# Patient Record
Sex: Female | Born: 1955 | Race: White | Hispanic: No | Smoking: Never smoker
Health system: Southern US, Community
[De-identification: ages and names within clinical notes are randomized; demographics above are authoritative.]

## PROBLEM LIST (undated history)

## (undated) DIAGNOSIS — K56609 Unspecified intestinal obstruction, unspecified as to partial versus complete obstruction: Secondary | ICD-10-CM

## (undated) DIAGNOSIS — M1990 Unspecified osteoarthritis, unspecified site: Secondary | ICD-10-CM

## (undated) DIAGNOSIS — Z86010 Personal history of colonic polyps: Secondary | ICD-10-CM

## (undated) DIAGNOSIS — I10 Essential (primary) hypertension: Secondary | ICD-10-CM

## (undated) DIAGNOSIS — K635 Polyp of colon: Secondary | ICD-10-CM

## (undated) DIAGNOSIS — K5790 Diverticulosis of intestine, part unspecified, without perforation or abscess without bleeding: Secondary | ICD-10-CM

## (undated) DIAGNOSIS — K5732 Diverticulitis of large intestine without perforation or abscess without bleeding: Secondary | ICD-10-CM

## (undated) DIAGNOSIS — R531 Weakness: Secondary | ICD-10-CM

## (undated) DIAGNOSIS — T7840XA Allergy, unspecified, initial encounter: Secondary | ICD-10-CM

## (undated) DIAGNOSIS — G4730 Sleep apnea, unspecified: Secondary | ICD-10-CM

## (undated) DIAGNOSIS — D649 Anemia, unspecified: Secondary | ICD-10-CM

## (undated) DIAGNOSIS — E669 Obesity, unspecified: Secondary | ICD-10-CM

## (undated) DIAGNOSIS — E079 Disorder of thyroid, unspecified: Secondary | ICD-10-CM

## (undated) HISTORY — DX: Diverticulosis of intestine, part unspecified, without perforation or abscess without bleeding: K57.90

## (undated) HISTORY — PX: TUBAL LIGATION: SHX77

## (undated) HISTORY — DX: Disorder of thyroid, unspecified: E07.9

## (undated) HISTORY — DX: Anemia, unspecified: D64.9

## (undated) HISTORY — DX: Unspecified intestinal obstruction, unspecified as to partial versus complete obstruction: K56.609

## (undated) HISTORY — PX: COLONOSCOPY W/ POLYPECTOMY: SHX1380

## (undated) HISTORY — DX: Polyp of colon: K63.5

## (undated) HISTORY — DX: Weakness: R53.1

## (undated) HISTORY — DX: Unspecified osteoarthritis, unspecified site: M19.90

## (undated) HISTORY — DX: Obesity, unspecified: E66.9

## (undated) HISTORY — DX: Allergy, unspecified, initial encounter: T78.40XA

## (undated) HISTORY — DX: Essential (primary) hypertension: I10

## (undated) HISTORY — DX: Sleep apnea, unspecified: G47.30

---
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
## 1999-05-19 ENCOUNTER — Other Ambulatory Visit: Admission: RE | Admit: 1999-05-19 | Discharge: 1999-05-19 | Payer: Self-pay | Admitting: Family Medicine

## 2000-09-19 ENCOUNTER — Other Ambulatory Visit: Admission: RE | Admit: 2000-09-19 | Discharge: 2000-09-19 | Payer: Self-pay | Admitting: Family Medicine

## 2001-12-22 ENCOUNTER — Other Ambulatory Visit: Admission: RE | Admit: 2001-12-22 | Discharge: 2001-12-22 | Payer: Self-pay | Admitting: Family Medicine

## 2003-10-15 ENCOUNTER — Encounter: Payer: Self-pay | Admitting: Internal Medicine

## 2004-07-16 ENCOUNTER — Encounter: Admission: RE | Admit: 2004-07-16 | Discharge: 2004-07-16 | Payer: Self-pay | Admitting: *Deleted

## 2004-07-21 ENCOUNTER — Ambulatory Visit (HOSPITAL_COMMUNITY): Admission: RE | Admit: 2004-07-21 | Discharge: 2004-07-21 | Payer: Self-pay | Admitting: *Deleted

## 2004-09-13 HISTORY — PX: SPINE SURGERY: SHX786

## 2004-09-13 HISTORY — PX: GASTRIC BYPASS: SHX52

## 2004-11-02 ENCOUNTER — Ambulatory Visit: Payer: Self-pay | Admitting: Psychology

## 2004-11-20 ENCOUNTER — Ambulatory Visit: Payer: Self-pay | Admitting: Psychology

## 2004-11-25 ENCOUNTER — Ambulatory Visit: Payer: Self-pay | Admitting: Psychology

## 2005-01-19 ENCOUNTER — Encounter: Admission: RE | Admit: 2005-01-19 | Discharge: 2005-04-19 | Payer: Self-pay | Admitting: *Deleted

## 2005-02-01 ENCOUNTER — Inpatient Hospital Stay (HOSPITAL_COMMUNITY): Admission: RE | Admit: 2005-02-01 | Discharge: 2005-02-03 | Payer: Self-pay | Admitting: *Deleted

## 2005-04-04 ENCOUNTER — Inpatient Hospital Stay (HOSPITAL_COMMUNITY): Admission: EM | Admit: 2005-04-04 | Discharge: 2005-04-06 | Payer: Self-pay | Admitting: *Deleted

## 2005-04-04 ENCOUNTER — Ambulatory Visit: Payer: Self-pay | Admitting: Sports Medicine

## 2005-04-15 ENCOUNTER — Ambulatory Visit (HOSPITAL_COMMUNITY): Admission: RE | Admit: 2005-04-15 | Discharge: 2005-04-16 | Payer: Self-pay | Admitting: Neurosurgery

## 2005-05-04 ENCOUNTER — Encounter: Admission: RE | Admit: 2005-05-04 | Discharge: 2005-08-02 | Payer: Self-pay | Admitting: *Deleted

## 2005-10-27 ENCOUNTER — Encounter: Admission: RE | Admit: 2005-10-27 | Discharge: 2006-01-25 | Payer: Self-pay | Admitting: *Deleted

## 2007-04-06 ENCOUNTER — Encounter: Payer: Self-pay | Admitting: Obstetrics and Gynecology

## 2007-04-06 ENCOUNTER — Ambulatory Visit (HOSPITAL_BASED_OUTPATIENT_CLINIC_OR_DEPARTMENT_OTHER): Admission: RE | Admit: 2007-04-06 | Discharge: 2007-04-06 | Payer: Self-pay | Admitting: Obstetrics and Gynecology

## 2007-07-10 ENCOUNTER — Encounter: Payer: Self-pay | Admitting: Obstetrics and Gynecology

## 2007-07-10 ENCOUNTER — Ambulatory Visit (HOSPITAL_BASED_OUTPATIENT_CLINIC_OR_DEPARTMENT_OTHER): Admission: RE | Admit: 2007-07-10 | Discharge: 2007-07-11 | Payer: Self-pay | Admitting: Obstetrics and Gynecology

## 2007-07-10 HISTORY — PX: ABDOMINAL HYSTERECTOMY: SHX81

## 2008-10-31 ENCOUNTER — Ambulatory Visit: Payer: Self-pay | Admitting: Internal Medicine

## 2008-10-31 DIAGNOSIS — Z9283 Personal history of failed moderate sedation: Secondary | ICD-10-CM | POA: Insufficient documentation

## 2008-10-31 DIAGNOSIS — D649 Anemia, unspecified: Secondary | ICD-10-CM | POA: Insufficient documentation

## 2008-10-31 LAB — CONVERTED CEMR LAB
Eosinophils Absolute: 0.3 10*3/uL (ref 0.0–0.7)
Ferritin: 23.9 ng/mL (ref 10.0–291.0)
Folate: 18.9 ng/mL
HCT: 38.5 % (ref 36.0–46.0)
Iron: 63 ug/dL (ref 42–145)
MCV: 86.3 fL (ref 78.0–100.0)
Monocytes Absolute: 0.4 10*3/uL (ref 0.1–1.0)
Neutro Abs: 3.2 10*3/uL (ref 1.4–7.7)
RDW: 12.8 % (ref 11.5–14.6)

## 2008-11-11 LAB — CONVERTED CEMR LAB: PTH: 60.9 pg/mL (ref 14.0–72.0)

## 2008-11-12 ENCOUNTER — Telehealth: Payer: Self-pay | Admitting: Internal Medicine

## 2008-11-26 ENCOUNTER — Encounter: Payer: Self-pay | Admitting: Internal Medicine

## 2008-11-28 ENCOUNTER — Ambulatory Visit: Payer: Self-pay | Admitting: Internal Medicine

## 2008-11-28 ENCOUNTER — Encounter: Payer: Self-pay | Admitting: Internal Medicine

## 2008-11-28 ENCOUNTER — Ambulatory Visit (HOSPITAL_COMMUNITY): Admission: RE | Admit: 2008-11-28 | Discharge: 2008-11-28 | Payer: Self-pay | Admitting: Internal Medicine

## 2008-12-10 ENCOUNTER — Encounter: Payer: Self-pay | Admitting: Internal Medicine

## 2009-07-18 ENCOUNTER — Encounter: Payer: Self-pay | Admitting: Surgery

## 2009-07-18 ENCOUNTER — Inpatient Hospital Stay (HOSPITAL_COMMUNITY): Admission: AD | Admit: 2009-07-18 | Discharge: 2009-07-21 | Payer: Self-pay | Admitting: Surgery

## 2009-08-25 ENCOUNTER — Encounter: Admission: RE | Admit: 2009-08-25 | Discharge: 2009-08-25 | Payer: Self-pay | Admitting: Surgery

## 2010-02-26 ENCOUNTER — Telehealth: Payer: Self-pay | Admitting: Internal Medicine

## 2010-10-13 NOTE — Progress Notes (Signed)
Summary: Schedule NP3 Visit  Phone Note Outgoing Call Call back at Home Phone [phone]   Call placed by: Harlow Mares CMA Duncan Dull),  February 26, 2010 2:13 PM Call placed to: Patient Summary of Call: No Answer, pt due for colonoscopy but needs to have her procedure done at the hospital with [facility] so Dr. Leone Payor would like for her to be seen in the office for a consult before its scheduled. She will need to schedule an NP3 visit.  Initial call taken by: Harlow Mares CMA Duncan Dull),  February 26, 2010 2:13 PM  Follow-up for Phone Call        pt number is disconnected, we will mail her a letter to call back and schedule an office visit. Follow-up by: Harlow Mares CMA Duncan Dull),  March 19, 2010 1:57 PM

## 2010-12-16 LAB — COMPREHENSIVE METABOLIC PANEL
ALT: 14 U/L (ref 0–35)
AST: 22 U/L (ref 0–37)
Calcium: 9.5 mg/dL (ref 8.4–10.5)
GFR calc Af Amer: >60 mL/min (ref >60)
Glucose, Bld: 129 mg/dL — ABNORMAL HIGH (ref 70–99)
Potassium: 3.9 mEq/L (ref 3.5–5.1)
Sodium: 138 mEq/L (ref 135–145)
Total Bilirubin: 0.6 mg/dL (ref 0.3–1.2)

## 2010-12-16 LAB — DIFFERENTIAL
Basophils Absolute: 0.1 10*3/uL (ref 0.0–0.1)
Basophils Relative: 1 % (ref 0–1)
Eosinophils Absolute: 0.1 10*3/uL (ref 0.0–0.7)
Monocytes Relative: 5 % (ref 3–12)
Neutrophils Relative %: 74 % (ref 43–77)

## 2010-12-16 LAB — URINALYSIS, ROUTINE W REFLEX MICROSCOPIC
Bilirubin Urine: NEGATIVE
Glucose, UA: NEGATIVE mg/dL
Nitrite: NEGATIVE
Specific Gravity, Urine: 1.027 (ref 1.005–1.030)
pH: 5.5 (ref 5.0–8.0)

## 2010-12-16 LAB — POCT PREGNANCY, URINE: Preg Test, Ur: NEGATIVE

## 2010-12-16 LAB — CBC
MCHC: 34.3 g/dL (ref 30.0–36.0)
RDW: 12.9 % (ref 11.5–15.5)

## 2011-01-26 NOTE — Op Note (Signed)
Felicia Zamora, Felicia Zamora                ACCOUNT NO.:  [id number]   MEDICAL RECORD NO.:  [id number]          PATIENT TYPE:  AMB   LOCATION:  [facility]                         FACILITY:  [facility]   PHYSICIAN:  Daniel L. Gottsegen, M.D.DATE OF BIRTH:  12/14/1955   DATE OF PROCEDURE:  04/06/2007  DATE OF DISCHARGE:                               OPERATIVE REPORT   PREOPERATIVE DIAGNOSIS:  Dysfunctional uterine bleeding with endometrial  polyps.   POSTOPERATIVE DIAGNOSIS:  Dysfunctional uterine bleeding with  endometrial polyps.   OPERATION:  Hysteroscopy, dilatation and curettage, and excision of  endometrial polyps.   SURGEON:  Daniel L. Eda Paschal, M.D.   ANESTHESIA:  General.   INDICATIONS:  The patient is a 55 year old gravida 4, para 4, AB 0, who  had persistent menometrorrhagia that has not responded to oral  progestins. In early July, it persisted and Megace had to be initiated  to stop the bleeding.  She had become anemic and was on iron for that,  as well.  She underwent an ultrasound in the office because of the  above. Findings included a small fibroid in her uterus but there was  echogenic fluid in the endometrial cavity.  The endometrial cavity was  irregular.  There were thickened areas. There were several focal areas  consistent with endometrial polyps. As a result of this, a saline  infusion hysterogram was done, a posterior wall defect was seen, it was  clearly in the wall of the uterus, but also seen were the endometrial  polyps.  She now enters the hospital for hysteroscopy and D&C.   FINDINGS:  External exam is normal.  BUS is normal.  Vaginal is normal.  Cervix is clean.  Uterus is enlarged by fibroids to about 9 weeks size.  There is almost second degree uterine descensus.  Adnexa failed to  reveal masses.   PROCEDURE:  After adequate general orotracheal anesthesia, the patient  was placed in the dorsal supine position, prepped and draped in the  usual sterile manner.   Pelvic exam was as noted.  A single tooth  tenaculum was placed in the anterior lip of the cervix.  The cervix  could be dilated with some difficulty but only up to a #27 Pratt dilator  and, at that point, it was almost impossible to dilate her further and  there was a slight tear on the anterior lip of the cervix that was  sustained by the pressure dilatation. It was elected at this point to  stop, use a diagnostic hysteroscope to be sure that she really needed a  resectoscope inserted to remove tissue.   The diagnostic hysteroscope was inserted. 3% sorbitol was used to expand  her intrauterine cavity.  A camera was used for magnification.  The  patient had significant polypoid tissue in her cavity and as a result of  this, it was felt that it could be more easily removed with the  resectoscope.  The hysteroscope was then removed.  A second single tooth  tenaculum was placed on the posterior lip of the cervix to try to reduce  tension in one  area and by pulling on both tenaculums and dilating, we  were then able to dilate her to a #31 Pratt dilator without further  lacerating her cervix.  The hysteroscopic resectoscope was introduced.  Once again, 3% sorbitol was used to expand the intrauterine cavity.  A  camera was used for magnification and using a 90 degrees wire loop, all  endometrial tissue and polyps were removed and sent to pathology for  tissue diagnosis.  There was significant tissue, both polypoid and  endometrium.   At the termination of the procedure, there was no bleeding noted and the  endometrium was completely clean.  Fluid deficit was approximately 200  mL. Blood loss was minimal.  The patient left the operating room in  satisfactory condition after a figure-of-eight 0 Vicryl suture was  placed in the small 12 o'clock cervical laceration.      Daniel L. Eda Paschal, M.D.  Electronically Signed     DLG/MEDQ  D:  04/06/2007  T:  04/06/2007  Job:  [id number]

## 2011-01-26 NOTE — Op Note (Signed)
NAMEJENNIFE, Felicia Zamora                ACCOUNT NO.:  [id number]   MEDICAL RECORD NO.:  [id number]          PATIENT TYPE:  AMB   LOCATION:  [facility]                         FACILITY:  [facility]   PHYSICIAN:  Daniel L. Gottsegen, M.D.DATE OF BIRTH:  May 28, 1956   DATE OF PROCEDURE:  07/10/2007  DATE OF DISCHARGE:                               OPERATIVE REPORT   PREOPERATIVE DIAGNOSES:  1. Menorrhagia.  2. Right lower quadrant pain.  3. 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   POSTOPERATIVE DIAGNOSES:  1. Menorrhagia.  2. Probable adenomyosis.  3. Normal ovaries and tubes.   OPERATIONS:  Laparoscopic-assisted vaginal hysterectomy.   SURGEON:  Dr. Eda Paschal.   FIRST ASSISTANT:  Dr. Audie Box.   INDICATIONS:  The patient is a 55 year old, gravida 4, para 4, AB 0 who  has been living with severely heavy periods for the past 1-2 years. Her  periods are lasting 9-10 days.  There is significant blood loss.  She  has clots and cramps. She has become anemic from the above.  She was  treated with medical therapy without any results. At one point earlier  this year, she had a saline infusion histogram that showed a large  endometrial polyp.  She was taken to the operating room and a  hysteroscopy D&C was done with removal of the polyp but her periods have  persisted in being very heavy. As a result of this, she now enters the  hospital for hysterectomy.  It is elected to start the procedure  laparoscopically because the patient is having right lower quadrant  pain. On ultrasound there was no pathology of the uterus but there  appeared to be a hydrosalpinx of her right fallopian tube.  She has  given me permission to remove one or both fallopian tubes if there is  pathology and even the right adnexa if there is pathology to warrant it  due to the right lower quadrant pain.   FINDINGS:  At the time of laparoscopy, the patient had an extremely  enlarged boggy uterus without any specific myomas.  It  appeared to be  most consistent with adenomyosis.  The uterus weighed over 180 grams.  Ovaries and fallopian tubes however were completely normal.  There was  no evidence of any hydrosalpinx as had been suggested on her ultrasound.   DESCRIPTION OF PROCEDURE:  After adequate general orotracheal  anesthesia, the patient was placed in the dorsal lithotomy position,  prepped and draped in the usual sterile manner.  A Foley catheter was  inserted in the patient's bladder,  a Hulka catheter was inserted in the  patient's uterus. Using the OptiVu and a 10 mm direct laparoscope and a  camera for magnification, the pelvis was entered directly through a  small subumbilical incision above her previous bypass laparoscopic  incision.  The peritoneal cavity could be entered without difficulty.  No adhesions below the umbilicus were encountered, pneumoperitoneum was  created. Two 5-mm ports were placed in the right and left lower  quadrant.  The pelvis was examined and was as noted above. As a result  of the lack of  adnexal pathology, both ovaries and tubes were kept in  place, the round ligaments were bipolared and cut, the utero-ovarian  ligaments and fallopian tubes were bipolared and cut.  This freed up the  uterus from the adnexa.  A bladder flap was developed without  difficulty.  The patient appeared to have no significant adhesions from  her previous C-section. The top of the uterine arteries were bipolared  and cut and then the surgeons went below. A 1:200,000 solution  epinephrine with 1/2% Xylocaine was injected, a 360-degree incision was  made around the cervix, the bladder was mobilized superiorly as was the  posterior peritoneum.  It took some difficulty to completely free up the  bladder flap from the uterus.  There were some adhesions encountered  here from her previous C-section but finally both the cul-de-sac could  be entered with sharp dissection as well as the vesicouterine fold  to  peritoneum. At this point the uterosacral ligaments, cardinal ligaments,  uterine arteries and balance of the broad ligament were clamped, cut and  suture ligated with #1 chromic catgut and then the uterus was delivered  and sent to pathology for tissue diagnosis.  The uterus was so large  that it had be partially morcellated in order to get it out.  Following  this the vaginal cuff was whip stitched to the posterior peritoneum with  a running locking #0 Vicryl.  This was done in such a fashion that the  uterosacral ligaments that had already been shortened were sutured to  the vault laterally for good vault support.  Copious irrigation was done  with sterile saline. Two sponge, needle, and instrument counts were  correct.  The peritoneum and cuff were closed with figure-of-eights of  #1 chromic catgut.  The surgeons then regloved and went above.  Copious  irrigation was done with sterile saline.  There were several slight  oozers that were controlled with bipolar and at the termination of  procedure the pelvis was dry. The  pneumoperitoneum was evacuated.  The  subumbilical fascial incision was closed with #0 Vicryl and the skin  incisions were closed with 3-0 Monocryl.  Estimated blood loss for the  entire procedure was 200 mL with none replaced.  The patient tolerated  the procedure well and left the operating room in satisfactory condition  draining clear urine from her Foley catheter.      Daniel L. Eda Paschal, M.D.  Electronically Signed     DLG/MEDQ  D:  07/10/2007  T:  07/10/2007  Job:  [id number]

## 2011-01-29 NOTE — Op Note (Signed)
Felicia Zamora, Felicia Zamora NO.:  [id number]   MEDICAL RECORD NO.:  [id number]          PATIENT TYPE:  INP   LOCATION:  X001                         [facility]ACILITY[facility]   PHYSICIAN:  Vikki Ports, MDDATE OF BIRTH:  1955/10/16   DATE OF PROCEDURE:  02/01/2005  DATE OF DISCHARGE:                                 OPERATIVE REPORT   PREOPERATIVE DIAGNOSES:  Morbid obesity.   POSTOPERATIVE DIAGNOSES:  Morbid obesity.   PROCEDURE:  Laparoscopic roux-en-Y gastric bypass, antecolic, antegastric,  left facing roux.   SURGEON:  Vikki Ports, MD.   ASSISTANTThornton Park. Daphine Deutscher, MD.   ANESTHESIA:  General.   DESCRIPTION OF PROCEDURE:  The patient was taken to the operating room,  placed in a supine position and after adequate general anesthesia was  induced using endotracheal tube, a Foley catheter was placed and the abdomen  was prepped and draped in the normal sterile fashion. Using a 12 mm Optiview  trocar under direct vision in the left upper quadrant, peritoneal access was  obtained. Pneumoperitoneum was obtained. Additional 12 mm and 5 mm trocars  were placed in the abdomen under direct vision. The ligament of Treitz was  identified and the small bowel was transected using a GIA white load  stapling device 40 cm distal to the ligament of Treitz. The distal end was  marked with a Penrose drain and I counted an additional 100 cm distal. A  side to side jejunojejunostomy was performed in the standard fashion using  the 60 mm GIA stapling load. The defect was closed with running 2-0 Vicryl  sutures and reinforced with Tisseel. The mesenteric defect was closed with a  running 2-0 silk suture. I then turned my attention to the upper abdomen. A  Nathanson liver retractor was placed in the upper abdomen and the left  lateral segment of liver was retracted anteriorly. The Guercio of His was  identified and sharply and bluntly dissected. An area of 4 cm distal to  the  EG junction on the lesser curve was identified and the lesser sac was  entered using the harmonic scalpel. Using serial firings of the blue load 60  mm GIA stapling device, approximately a 4-5 cm tubular pouch was created.  The gastric remnant was over sewn with a running locking 2-0 silk suture. A  side to side gastrojejunostomy was then performed in two layers with a  running posterior 2-0 Vicryl and then a 45 mm GIA stapling device. The  defect was closed with running 2-0 Vicryl sutures and an anterior layer was  performed using a 2-0 Vicryl suture. The roux limb was clamped and then  upper endoscopy was performed by Dr. Daphine Deutscher and will be dictated in a  separate report. This showed patency of the anastomosis and no evidence of  leak. Peterson's defect was then closed from the base of the mesentery of  the roux limb up to the gastric remnant with a running 2-0 silk suture. The  gastrojejunostomy was reinforced with Tisseel. The pneumoperitoneum was  released, trocars were removed, skin was closed with  staples and dressings  were applied. Of note, there was a questionable hypopharynx trauma  during endoscopy and we visualized this with the a laryngoscope and it  appeared to have a mucosal tear but the musculature was intact. The patient  was extubated without difficulty and taken to the recovery room in good  condition.      KRH/MEDQ  D:  02/01/2005  T:  02/01/2005  Job:  [id number]

## 2011-01-29 NOTE — Op Note (Signed)
Felicia Zamora, Felicia Zamora                ACCOUNT NO.:  [id number]   MEDICAL RECORD NO.:  [id number]          PATIENT TYPE:  INP   LOCATION:  1510                         [facility]ACILITY:  [facility]   PHYSICIAN:  Thornton Park. Daphine Deutscher, MD  DATE OF BIRTH:  08-01-1956   DATE OF PROCEDURE:  02/01/2005  DATE OF DISCHARGE:                                 OPERATIVE REPORT   PREOPERATIVE DIAGNOSIS:  Laparoscopic gastric bypass in progress.   POSTOPERATIVE DIAGNOSES:  Laparoscopic gastric bypass in progress,  questionable small pharyngeal laceration.   SURGEON:  Thornton Park. Daphine Deutscher, M.D.   ANESTHESIA:  General.   DESCRIPTION OF PROCEDURE:  Felicia Zamora is undergoing laparoscopic Roux-en-Y  gastric bypass.  When the gastrojejunostomy had been completed, I broke  scrub and went to the head of the table.  The patient was in supine  position, was intubated.  She had previously had an Ewald tube passed. I  inserted my gloved finger into the hypopharynx, elevated the tongue, and  with the endoscope completely unlocked, gently passed this into the  hypopharynx.  It met with some resistance and observed and watched under  direct vision and could see there was subtle bleeding that had pooled there.  I pulled back and with one view thought that I could see an opening that did  not look like an opening into the esophagus and was suspicious.  I went  ahead and changed the Aubry and looked past it down the esophagus and  performed the endoscopy to reveal about a 4.5-5 cm pouch in length that was  really under 40 cm, indicating she had a slightly shortened esophagus from  the norm.  The esophagus was unremarkable, and the pouch showed no evidence  of bleeding and with insufflation and clamping, there was no evidence of  leak.  On the way back, I attempted to visualize the area in question again,  and again saw just some blood pooling, and after withdrawing the scope, got  Dr. Shireen Quan in where we used the video laryngoscope to  lift up in behind  the endotracheal tube there appeared to be a little linear rent in the  posterior hypopharynx.  I showed this to Dr. Luan Pulling and appropriate  calls were made for ENT consultation.  There was no evidence of any gas in  the neck, and this just looked like a little linear laceration.      MBM/MEDQ  D:  02/01/2005  T:  02/01/2005  Job:  [id number]   cc:   [facility]

## 2011-01-29 NOTE — H&P (Signed)
NAMEALEJANDRIA, WESSELLS NO.:  [id number]   MEDICAL RECORD NO.:  [id number]          PATIENT TYPE:  OBV   LOCATION:  3023                         FACILITY:  [facility]   PHYSICIAN:  Melina Fiddler, MD DATE OF BIRTH:  [birth year]   DATE OF ADMISSION:  04/03/2005  DATE OF DISCHARGE:                                HISTORY & PHYSICAL   CHIEF COMPLAINT:  Neck and arm pain.   HISTORY OF PRESENT ILLNESS:  A 55 year old white female with a past medical  history significant for gastric bypass surgery 2 months ago and bilateral  shoulder tendinitis in the last year, who presents with unbearable left mid  arm pain and left-sided paracervical pain only present with being upright  greater than 30 to 60 seconds.  The pain is nearly unnoticeable if lying  down and has very slight cervical pain with head rotation to the left.  The  patient was on a treadmill 2 days ago when the power went out and the  patient was jerked on the treadmill when it stopped. She did not fall or hit  her head.  She has been taking Roxicet for pain control.   REVIEW OF SYSTEMS:  Negative for vision changes, positive for slight  headache.  No nausea, no vomiting, no diarrhea.  Some complications since  she started her gastric bypass.  No bowel or bladder incontinence.  No  numbness, tingling, or extremities.  No weakness.   PAST MEDICAL HISTORY:  Significant for gastric bypass Feb 01, 2005, and a  history of bilateral shoulder tendinitis.   ALLERGIES:  SULFA DRUGS cause nausea and vomiting.   MEDICATIONS:  Multivitamin and iron.   SOCIAL HISTORY:  She lives with her 33 year old daughter, husband, and a  stepson.  She does not smoke, drink alcohol, or use any other drugs.   FAMILY HISTORY:  Significant for a father who died at 24.  He had history of  MI, hypertension, and diabetes, a mother who died at age 15 with stomach  cancer who also had history of MI, hypertension, and diabetes.   PHYSICAL  EXAMINATION:  VITAL SIGNS:  Temperature 97.5, pulse 90, blood  pressure 122/80, respirations 20, and 96% on room air.  GENERAL:  She is lying on a stretcher and appears in no acute distress.  With sitting, she appears in exquisite pain holding her left arm and neck.  HEENT:  Pupils equal, round, and reactive to light and accommodation.  Normocephalic and atraumatic.  Face symmetrical.  Oral mucosa pink and  moist.  No pharyngeal erythema.  CARDIOVASCULAR:  S1 and S2, regular rate and rhythm, no murmurs, rubs, or  gallops.  +2 peripheral pulses.  LUNGS:  Clear to auscultation.  No wheezes, rales, or rhonchi.  Equal  excursion bilaterally.  ABDOMEN:  Positive bowel sounds, soft, nontender, and nondistended.  No  organomegaly, no masses.  EXTREMITIES:  Negative for cyanosis, clubbing, or edema.  NEUROLOGY:  Cranial nerves II-XII grossly intact.  5/5 strength in both  upper and lower extremities.  Reflexes +2.  MUSCULOSKELETAL:  No point tenderness along the  vertebra, no muscle  tenderness with palpation.   LABORATORY DATA:  MRA/MRI with meningeal enhancement could be insignificant,  consistent with meningitis or SAH.   Sed rate of 30.  White count 8.2, hemoglobin 12.4, hematocrit 38.1,  platelets 266.   ASSESSMENT:  A 55 year old white female with neck and arm pain.   Problem 1.  Pain etiology unclear.  Significant for massive pain on sitting  up, but MRA/MRI negative for mass or bleed, although concern for meningitis,  although without the patient having any meningeal signs.  This is lower on  the differential.  May be musculoskeletal in origin given the location,  could be nerve impingement worsened by sitting.  Will await labs for further  workup.  Still awaiting CBC, BMP, and coag studies.  Assuming all is  negative, will start her on Lyrica for neuropathic pain and will admit and  monitor overnight.   Problem 2.  History of gastric bypass.  Will give protein diet,   multivitamins, and iron.      Ta   TH/MEDQ  D:  04/03/2005  T:  04/03/2005  Job:  [id number]

## 2011-01-29 NOTE — Op Note (Signed)
Felicia Zamora, Felicia Zamora NO.:  [id number]   MEDICAL RECORD NO.:  [id number]          PATIENT TYPE:  OIB   LOCATION:  3033                         FACILITY:  [facility]   PHYSICIAN:  Danae Orleans. Venetia Maxon, M.D.  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   OPERATION PERFORMED:  Anterior cervical decompression and fusion, C5-6 level  with PEEK interbody cage and morcellized bone autograft, with anterior  cervical plate, C5 through C6 level.   SURGEON:  Danae Orleans. Venetia Maxon, M.D.   ASSISTANT:  Stefani Dama, M.D.   ANESTHESIA:  General endotracheal.   ESTIMATED BLOOD LOSS:  Less than 50 mL.   COMPLICATIONS:  None.   DISPOSITION:  Recovery.   INDICATIONS FOR PROCEDURE:  Felicia Zamora is a 55 year old with a severe left  C6 radiculopathy.  She has a herniated disk with some spondylitic changes  and it was elected to take  to surgery for anterior cervical decompression  and fusion.   DESCRIPTION OF PROCEDURE:  Felicia Zamora was brought to the operating room.  Following satisfactory and uncomplicated induction of general endotracheal  anesthesia and placement of intravenous lines, the patient was placed in  supine position on the operating table.  The neck was placed in slight  extension and she was placed in 10 pounds of halter traction.  The anterior  neck was then prepped and draped in the usual sterile fashion.  The area of  planned incision was infiltrated with 0.25% Marcaine and 0.5% lidocaine  1:200,000 epinephrine.  Incision was made in the midline to the anterior  border of the sternocleidomastoid muscle, carried sharply through the  platysma layer.  Using blunt dissection, the carotid sheath was kept  lateral, the trachea and esophagus kept medial exposing the anterior  cervical spine.  A bent spinal needle was placed at what was felt to be the  C5-6 level and this was confirmed on intraoperative x-ray.  Subsequently,  using electrocautery and Key elevator, the longus colli muscles were taken  down from C5 through C6 bilaterally and self-retaining Shadowline retractor  was placed to facilitate exposure.  The C5-6 interspace was then incised  with a 15 blade and disk material was removed in piecemeal fashion.  End  plates were stripped of residual disk material using a variety of Carlens  microcurettes.  A disk space spreader was placed.  Additional diskectomy was  performed to the posterior longitudinal ligament.  The microscope was  brought into the field and using microdissection technique, the end plates  were stripped of residual disk material and decorticated and uncinate spurs  drilled down.  The posterior longitudinal ligament was then incised with an  arachnoid knife and was removed in piecemeal fashion.  Spinal cord dura and  both C6 nerve roots were decompressed.  There was a significant amount of  herniated disk material under the posterior longitudinal ligament on the  left of the midline with a compression of the left C6 nerve root.  This was  decompressed and multiple fragments of disk material were removed.  The  foramen was felt to be well decompressed at this point.  Hemostasis was  assured with Gelfoam soaked in Thrombin.  After attaining adequate  hemostasis, a trial sizer was used which demonstrated a well fitting 7 mm  graft size.  A PEEK cage was then selected, packed with morcellized bone  autograft which had been retained from the drilling of the end plates. This  was then inserted into the interspace and countersunk appropriately.  A 16  mm Alphatek anterior cervical plate was then affixed to  the anterior  cervical spine using 12 mm variable Coughlin screws, two at C5, two at C6.  All  screws had excellent purchase.  Locking mechanisms were engaged.  Final x-  ray demonstrated well positioned cage and anterior cervical plate.  The  Halter traction was removed prior to placing the plate.  The wound was then  copiously irrigated with bacitracin saline.  Soft tissues were inspected and  found to be in good repair.  The platysma layer was closed with 3-0 Vicryl  sutures and skin edges were reapproximated with interrupted 3-0 Vicryl  subcuticular stitch.  The wound was dressed with Dermabond.  The patient was  extubated in the operating room and taken to the recovery room in stable and  satisfactory condition having tolerated the operation well.  Counts were  correct at the end of the case.       JDS/MEDQ  D:  04/15/2005  T:  04/16/2005  Job:  [id number]

## 2011-01-29 NOTE — Discharge Summary (Signed)
NAMECHLOIE, LONEY NO.:  [id number]   MEDICAL RECORD NO.:  [id number]          PATIENT TYPE:  INP   LOCATION:  3023                         FACILITY:  [facility]   PHYSICIAN:  Angeline Slim, M.D.  DATE OF BIRTH:  1956/03/02   DATE OF ADMISSION:  04/03/2005  DATE OF DISCHARGE:  04/06/2005                                 DISCHARGE SUMMARY   There are no consulting physicians.   DIAGNOSES:  1.  C5-6 radiculopathy.  2.  Status post gastric bypass.   PROCEDURES:  1.  On April 03, 2005, MRI of the brain showing no acute disease.  2.  On April 03, 2005, MRI of the neck showing evidence of C5-6 compression.   DISCHARGE MEDICATIONS:  1.  Lyrica 75 mg p.o. t.i.d. times two days, then 100 mg p.o. t.i.d.  2.  Tylenol 650 mg p.o. q.4h.  3.  Senokot-S one tablet p.o. daily.  4.  Prednisone taper 40 mg p.o. daily times two days, then 20 mg p.o. daily      times two days, then 10 mg p.o. daily times two days.  5.  Roxanol 10 mg p.o. q.4h. p.r.n. pain.   HOSPITAL COURSE:  The patient was admitted on April 03, 2005, for intense and  extreme pain in her neck and left arm after a violent jerk on a treadmill.  There was no loss of consciousness. The patient did not fall. She just  developed pain in her neck and arm after an intense jerk on the treadmill.  She got an MRI of the brain that showed no acute disease. She was admitted  and started on Lyrica 50 mg t.i.d. and Percocet to control her pain. She  then got an MRI of the  neck showing a C5-6 compression of the nerve root.  This is consistent with her symptoms which were around the C5-6 dermatome.  Her strength was intact throughout the hospital stay at 5/5 bilateral upper  extremities. It was just her intense pain that needed to be controlled.  Finally, on April 06, 2005, her pain was under better control, although not  completely resolved and it was determined that she was able to go home and  attend to her activities of  daily living. Her pain is best when lying down,  worse when standing, and worst of all when sitting up. It is more bearable  at this time.  She was also started on prednisone 60 mg at the time of  admission. She will go home on a prednisone taper and we will increase her  Lyrica to 100 mg by the end of the week. She will have Tylenol and Roxanol  for any breakthrough pain.   ADMISSION LABORATORIES:  White count 8.2, hemoglobin 12.4, hematocrit 38.1,  platelet count 266,000. Sed rate 30.  Sodium 140, potassium 4.2, chloride  105, CO2 28, glucose 94, BUN 7, creatinine 0.8, calcium 9.1.  CK 46.  PTT  28, INR 0.9.   1.  C5-6 radiculopathy: The patient never had any motor symptoms. It was      strictly a  pain issue. Her pain was improved by April 06, 2005, at      discharge; however still present.  We have highly that the patient get      an outpatient neurosurgery referral. In the meantime she can be      medically managed with Lyrica 100 mg t.i.d. and other medications for      breakthrough pain.  2.  Discharge laboratories: Sodium 139, potassium 139, potassium 3.6,      chloride 104, CO2 26, glucose 88, BUN 14, creatinine 0.8, calcium 8.7.      White count 9.3, hemoglobin 11.5, hematocrit 34.7, platelet count      287,000.   FOLLOWUP ITEMS:  1.  C5-6 radiculopathy. The patient should receive an outpatient      neurosurgery referral as soon as possible and take Lyrica in the      meantime.  2.  The patient will follow up with her primary care physician at [facility]. I was unable to call and make an appointment at the time of      discharge, so the patient was instructed to set up her follow-up      appointment by calling [facility] at [phone] for a follow-up      within one week.       AL/MEDQ  D:  04/06/2005  T:  04/06/2005  Job:  [id number]   cc:   [facility]

## 2011-12-07 ENCOUNTER — Encounter: Payer: Self-pay | Admitting: Physician Assistant

## 2012-01-19 ENCOUNTER — Other Ambulatory Visit: Payer: Self-pay | Admitting: Physician Assistant

## 2012-02-16 ENCOUNTER — Ambulatory Visit (INDEPENDENT_AMBULATORY_CARE_PROVIDER_SITE_OTHER): Payer: Managed Care, Other (non HMO) | Admitting: Family Medicine

## 2012-02-16 VITALS — BP 133/89 | HR 87 | Temp 98.1°F | Resp 18 | Ht 65.0 in | Wt 253.6 lb

## 2012-02-16 DIAGNOSIS — R600 Localized edema: Secondary | ICD-10-CM

## 2012-02-16 DIAGNOSIS — R252 Cramp and spasm: Secondary | ICD-10-CM

## 2012-02-16 DIAGNOSIS — R609 Edema, unspecified: Secondary | ICD-10-CM

## 2012-02-16 MED ORDER — FUROSEMIDE 20 MG PO TABS: 20.0000 mg | ORAL_TABLET | Freq: Every day | ORAL | Status: DC

## 2012-02-16 MED ORDER — MAGNESIUM OXIDE 400 MG PO TABS: 400.0000 mg | ORAL_TABLET | Freq: Two times a day (BID) | ORAL | Status: AC

## 2012-02-16 NOTE — Patient Instructions (Signed)

## 2012-02-16 NOTE — Progress Notes (Signed)
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 She's had this every summer for several years. In the past, she is taking diuretics which have helped but have also caused cramps in her legs.  Family history: Reviewed see chart  Past medical history: Reviewed see chart  Patient denies chest pain, shortness of breath, kidney disease, thyroid problems. She does not drink alcohol regularly  Objective: 1-2+ edema in  lower extremities no skin rashes or erythema Chest: Clear Heart: Regular no murmur Neck: Supple no adenopathy or thyromegaly  Assessment: Pedal edema, related to heat and obesity  Plan: Advocate for weight loss, magnesium oxide to prevent cramps, Lasix 20 mg daily, return if problems persist more than we

## 2012-03-18 ENCOUNTER — Ambulatory Visit (INDEPENDENT_AMBULATORY_CARE_PROVIDER_SITE_OTHER): Payer: Managed Care, Other (non HMO) | Admitting: Family Medicine

## 2012-03-18 VITALS — BP 141/72 | HR 102 | Temp 98.4°F | Resp 17 | Ht 65.0 in | Wt 253.0 lb

## 2012-03-18 DIAGNOSIS — H209 Unspecified iridocyclitis: Secondary | ICD-10-CM

## 2012-03-18 DIAGNOSIS — M1710 Unilateral primary osteoarthritis, unspecified knee: Secondary | ICD-10-CM

## 2012-03-18 MED ORDER — DICLOFENAC SODIUM 75 MG PO TBEC: 75.0000 mg | DELAYED_RELEASE_TABLET | Freq: Two times a day (BID) | ORAL | Status: DC

## 2012-03-18 MED ORDER — PREDNISOLONE ACETATE 1 % OP SUSP: 1.0000 [drp] | Freq: Three times a day (TID) | OPHTHALMIC | Status: AC

## 2012-03-18 NOTE — Patient Instructions (Addendum)
Iritis Iritis is an inflammation of the colored part of the eye (iris). Other parts at the front of the eye may also be inflamed. The iris is part of the middle layer of the eyeball which is called the uvea or the uveal track. Any part of the uveal track can become inflamed. The other portions of the uveal track are the choroid (the thin membrane under the outer layer of the eye), and the ciliary body (joins the choroid and the iris and produces the fluid in the front of the eye).  It is extremely important to treat iritis early, as it may lead to internal eye damage causing scarring or diseases such as glaucoma. Some people have only one attack of iritis (in one or both eyes) in their lifetime, while others may get it many times. CAUSES Iritis can be associated with many different diseases, but mostly occurs in otherwise healthy people. Examples of diseases that can be associated with iritis include:  Diseases where the body's immune system attacks tissues within your own body (autoimmune diseases).   Infections (tuberculosis, gonorrhea, fungus infections, Lyme disease, infection of the lining of the heart).   Trauma or injury.   Eye diseases (acute glaucoma and others).   Inflammation from other parts of the uveal track.   Severe eye infections.   Other rare diseases.  SYMPTOMS  Eye pain or aching.   Sensitivity to light.   Loss of sight or blurred vision.   Redness of the eye. This is often accompanied by a ring of redness around the outside of the cornea, or clear covering at the front of the eye (ciliary flush).   Excessive tearing of the eye(s).   A small pupil that does not enlarge in the dark and stays smaller than the other eye's pupil.   A whitish area that obscures the lower part of the colored circular iris. Sometimes this is visible when looking at the eye, where the whitish area has a "fluid level" or flat top. This is called a "hypopyon" and is actually pus inside the  eye.  Since iritis causes the eye to become red, it is often confused with a much less dangerous form of "pink eye" or conjunctivitis. One of the most important symptoms is sensitivity to light. Anytime there is redness, discomfort in the eye(s) and extreme light sensitivity, it is extremely important to see an ophthalmologist as soon as possible. TREATMENT Acute iritis requires prompt medical evaluation by an eye specialist (ophthalmologist.) Treatment depends on the underlying cause but may include:  Corticosteroid eye drops and dilating eye drops. Follow your caregiver's exact instructions on taking and stopping corticosteroid medications (drops or pills).   Occasionally, the iritis will be so severe that it will not respond to commonly used medications. If this happens, it may be necessary to use steroid injections. The injections are given under the eye's outer surface. Sometimes oral medications are given. The decision on treatment used for iritis is usually made on an individual basis.  HOME CARE INSTRUCTIONS Your care giver will give specific instructions regarding the use of eye medications or other medications. Be certain to follow all instructions in both taking and stopping the medications. SEEK IMMEDIATE MEDICAL CARE IF:  You have redness of one or both eye.   You experience a great deal of light sensitivity.   You have pain or aching in either eye.  MAKE SURE YOU:   Understand these instructions.   Will watch your condition.  Will get help right away if you are not doing well or get worse.  Document Released: 08/30/2005 Document Revised: 08/19/2011 Document Reviewed: 02/17/2007 [facility] Patient Information 2012 [place], [place].

## 2012-03-18 NOTE — Progress Notes (Signed)
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 She's had this every summer for several years. In the past, she is taking diuretics which have helped but have also caused cramps in her legs.  The magnesium has stopped the cramps and the swelling is less  She complains of right knee pain for 1 month.  Some crepitus, no giving out. Stiff qam  Also complains of red eyes, left worse than right, for several days.  No disch.  Wears contacts.  Assoc with photophobia  O:  NAD Left eye injected> than right eye with normal fundi.  EOM nl.  The erythema is perilimbal Right knee: nontender with FROM, no effusion, no lig laxity.  A:  Iritis Mild knee arthritis  P:

## 2012-05-02 ENCOUNTER — Other Ambulatory Visit: Payer: Self-pay | Admitting: Family Medicine

## 2012-05-02 NOTE — Telephone Encounter (Signed)
?   Ok x 1, needs OV for more

## 2012-05-03 ENCOUNTER — Ambulatory Visit: Payer: Managed Care, Other (non HMO)

## 2012-05-03 ENCOUNTER — Ambulatory Visit (INDEPENDENT_AMBULATORY_CARE_PROVIDER_SITE_OTHER): Payer: Managed Care, Other (non HMO) | Admitting: Family Medicine

## 2012-05-03 VITALS — BP 138/88 | HR 107 | Temp 98.8°F | Resp 16 | Ht 64.5 in | Wt 257.6 lb

## 2012-05-03 DIAGNOSIS — K5732 Diverticulitis of large intestine without perforation or abscess without bleeding: Secondary | ICD-10-CM

## 2012-05-03 DIAGNOSIS — R350 Frequency of micturition: Secondary | ICD-10-CM

## 2012-05-03 DIAGNOSIS — R109 Unspecified abdominal pain: Secondary | ICD-10-CM

## 2012-05-03 DIAGNOSIS — R1084 Generalized abdominal pain: Secondary | ICD-10-CM

## 2012-05-03 DIAGNOSIS — K5792 Diverticulitis of intestine, part unspecified, without perforation or abscess without bleeding: Secondary | ICD-10-CM

## 2012-05-03 LAB — POCT UA - MICROSCOPIC ONLY
Casts, Ur, LPF, POC: NEGATIVE
Crystals, Ur, HPF, POC: NEGATIVE
RBC, urine, microscopic: NEGATIVE
Yeast, UA: NEGATIVE

## 2012-05-03 LAB — POCT CBC
Granulocyte percent: 72 % (ref 37–80)
HCT, POC: 34.2 % — AB (ref 37.7–47.9)
Hemoglobin: 10.1 g/dL — AB (ref 12.2–16.2)
Lymph, poc: 2 (ref 0.6–3.4)
MCH, POC: 23.1 pg — AB (ref 27–31.2)
MCHC: 29.5 g/dL — AB (ref 31.8–35.4)
MCV: 78.2 fL — AB (ref 80–97)
MID (cbc): 0.5 (ref 0–0.9)
MPV: 9.6 fL (ref 0–99.8)
POC Granulocyte: 6.4 (ref 2–6.9)
POC LYMPH PERCENT: 22.4 % (ref 10–50)
POC MID %: 5.6 % (ref 0–12)
Platelet Count, POC: 332 K/uL (ref 142–424)
RBC: 4.37 M/uL (ref 4.04–5.48)
RDW, POC: 17.6 %
WBC: 8.9 K/uL (ref 4.6–10.2)

## 2012-05-03 LAB — POCT URINALYSIS DIPSTICK
Bilirubin, UA: NEGATIVE
Blood, UA: NEGATIVE
Glucose, UA: NEGATIVE
Leukocytes, UA: NEGATIVE
Nitrite, UA: NEGATIVE
Protein, UA: NEGATIVE
Spec Grav, UA: 1.02
Urobilinogen, UA: 0.2
pH, UA: 5.5

## 2012-05-03 MED ORDER — CIPROFLOXACIN HCL 500 MG PO TABS: 500.0000 mg | ORAL_TABLET | Freq: Two times a day (BID) | ORAL | Status: AC

## 2012-05-03 MED ORDER — METRONIDAZOLE 500 MG PO TABS: 500.0000 mg | ORAL_TABLET | Freq: Two times a day (BID) | ORAL | Status: DC

## 2012-05-03 NOTE — Patient Instructions (Signed)
Recheck in the next 1-2 days for your abdominal pain. Start antibiotics tonight.  Return to the clinic or go to the nearest emergency room if any of your symptoms worsen or new symptoms occur.  Follow up with Dr. Milus Glazier next week to discuss your knee pain.

## 2012-05-03 NOTE — Progress Notes (Signed)
Subjective:    Patient ID: Felicia Zamora, female    DOB: Jul 21, 1956, 56 y.o.   MRN: [id number]  HPI Felicia Zamora is a 56 y.o. female Left lower abd pain - intially noticed about 6 days ago - mild discomfort initially.  Worse since yesterday.   Urinating more last night.  Frequency.  Less today. Increased gas and feels  No fever,  Small amt diarrhea yesterday. Hx of diverticultis in past  - thinks may have had a few mild flairs that resolved on own. Last treated with antibiotics.for diverticulitis few years ago.   Hx of gastric bypass in 2006.   Occasional tingling in lower back - past week. No radiation - only in one spot.  Had problem with knees - needed refill of meds - has called in for this.   Tx: none.    Review of Systems  Constitutional: Negative for fever and chills.  Gastrointestinal: Positive for abdominal pain, diarrhea and abdominal distention (feels bloated. increased gas recently. ). Negative for nausea, vomiting, blood in stool and anal bleeding.  Genitourinary: Positive for urgency and frequency. Negative for dysuria, hematuria, flank pain and decreased urine volume.       Objective:   Physical Exam  Constitutional: She appears well-developed and well-nourished.       Overweight.   HENT:  Head: Normocephalic and atraumatic.  Right Ear: External ear normal.  Left Ear: External ear normal.  Cardiovascular: Normal rate, regular rhythm, normal heart sounds and intact distal pulses.   Pulmonary/Chest: Effort normal and breath sounds normal.  Abdominal: Soft. Normal appearance and bowel sounds are normal. There is no hepatosplenomegaly. There is tenderness in the left lower quadrant. There is no rigidity, no rebound, no guarding and no CVA tenderness.    Musculoskeletal:       Arms:  Results for orders placed in visit on 05/03/12  POCT UA - MICROSCOPIC ONLY      Component Value Range   WBC, Ur, HPF, POC 0-2     RBC, urine, microscopic neg     Bacteria, U  Microscopic trace     Mucus, UA small     Epithelial cells, urine per micros 1-3     Crystals, Ur, HPF, POC neg     Casts, Ur, LPF, POC neg     Yeast, UA neg    POCT URINALYSIS DIPSTICK      Component Value Range   Color, UA yellow     Clarity, UA cloudy     Glucose, UA neg     Bilirubin, UA neg     Ketones, UA trace     Spec Grav, UA 1.020     Blood, UA neg     pH, UA 5.5     Protein, UA neg     Urobilinogen, UA 0.2     Nitrite, UA neg     Leukocytes, UA Negative    POCT CBC      Component Value Range   WBC 8.9  4.6 - 10.2 K/uL   Lymph, poc 2.0  0.6 - 3.4   POC LYMPH PERCENT 22.4  10 - 50 %L   MID (cbc) 0.5  0 - 0.9   POC MID % 5.6  0 - 12 %M   POC Granulocyte 6.4  2 - 6.9   Granulocyte percent 72.0  37 - 80 %G   RBC 4.37  4.04 - 5.48 M/uL   Hemoglobin 10.1 (*) 12.2 - 16.2 g/dL  HCT, POC 34.2 (*) 37.7 - 47.9 %   MCV 78.2 (*) 80 - 97 fL   MCH, POC 23.1 (*) 27 - 31.2 pg   MCHC 29.5 (*) 31.8 - 35.4 g/dL   RDW, POC 16.1     Platelet Count, POC 332  142 - 424 K/uL   MPV 9.6  0 - 99.8 fL   [facility] reading (PRIMARY) by  Dr. Neva Seat:  AAS -nonspecific bg findings. .       Assessment & Plan:  Felicia Zamora is a 56 y.o. female 1. Abdominal pain  POCT UA - Microscopic Only, POCT urinalysis dipstick, POCT CBC, DG Abd Acute W/Chest  2. Diverticulitis  ciprofloxacin (CIPRO) 500 MG tablet, metroNIDAZOLE (FLAGYL) 500 MG tablet  3. Urinary frequency  Urine culture    Likely diverticultis with history of same and exam. Possible sigmoid involvement with urinary symptoms.  Check urine cx.  Start abx above and recheck in 24-48 hours - if not improving - consider CT abd/pelvis.   Tingling of back - ? pinced nerve or trigger point.  Trial of heat or ice to area - recheck next week.  rtc if rash occurs.   Knee pain - see prior ov.  Has had some relief with voltaren. Plan to follow up with Dr. Milus Glazier next week to decide follow up.  Refill of diclofenac already in system.

## 2012-05-05 LAB — URINE CULTURE

## 2012-05-18 ENCOUNTER — Other Ambulatory Visit: Payer: Self-pay

## 2012-05-18 DIAGNOSIS — K5792 Diverticulitis of intestine, part unspecified, without perforation or abscess without bleeding: Secondary | ICD-10-CM

## 2012-05-19 ENCOUNTER — Encounter: Payer: Self-pay | Admitting: Internal Medicine

## 2012-06-22 ENCOUNTER — Ambulatory Visit: Payer: Self-pay | Admitting: Internal Medicine

## 2012-08-14 ENCOUNTER — Ambulatory Visit (INDEPENDENT_AMBULATORY_CARE_PROVIDER_SITE_OTHER): Payer: Managed Care, Other (non HMO) | Admitting: Family Medicine

## 2012-08-14 VITALS — BP 114/76 | HR 88 | Temp 97.9°F | Resp 16 | Ht 65.5 in | Wt 259.0 lb

## 2012-08-14 DIAGNOSIS — M549 Dorsalgia, unspecified: Secondary | ICD-10-CM

## 2012-08-14 DIAGNOSIS — M461 Sacroiliitis, not elsewhere classified: Secondary | ICD-10-CM

## 2012-08-14 LAB — POCT URINALYSIS DIPSTICK
Bilirubin, UA: NEGATIVE
Glucose, UA: NEGATIVE
Leukocytes, UA: NEGATIVE
Nitrite, UA: NEGATIVE
pH, UA: 5.5

## 2012-08-14 LAB — POCT UA - MICROSCOPIC ONLY
Epithelial cells, urine per micros: NEGATIVE
Yeast, UA: NEGATIVE

## 2012-08-14 MED ORDER — MELOXICAM 7.5 MG PO TABS: 7.5000 mg | ORAL_TABLET | Freq: Every day | ORAL | Status: DC

## 2012-08-14 MED ORDER — METHYLPREDNISOLONE ACETATE 80 MG/ML IJ SUSP
80.0000 mg | Freq: Once | INTRAMUSCULAR | Status: AC
  Administered 2012-08-14: 80 mg via INTRAMUSCULAR

## 2012-08-14 MED ORDER — HYDROCODONE-ACETAMINOPHEN 5-500 MG PO TABS: 1.0000 | ORAL_TABLET | ORAL | Status: DC | PRN

## 2012-08-14 NOTE — Progress Notes (Signed)
Subjective: Patient has had a history of back pain problems in the past. 2 months ago she was in visiting in [place] and had severe low back pain and went to a doctor there. She had x-rays which just showed some mild degenerative disease. She was treated with an inside and muscle relaxants. Then save culture some dyspepsia after a few days and she had to stop it. She has a history of gastric bypass. She says this feels different. Doesn't feel like tight muscles. She is very tender and painful at the base of her spine.   Objective: Good range of motion of her back she is very tender over the right SI joint region abdomen soft without mass or tenderness. Straight leg raising is negative.  Assessment: Sacroiliitis  Plan:  Check urinalysis before treatment  Results for orders placed in visit on 08/14/12  POCT UA - MICROSCOPIC ONLY      Component Value Range   WBC, Ur, HPF, POC 0-1     RBC, urine, microscopic 0-1     Bacteria, U Microscopic trace     Mucus, UA neg     Epithelial cells, urine per micros neg     Crystals, Ur, HPF, POC neg     Casts, Ur, LPF, POC neg     Yeast, UA neg    POCT URINALYSIS DIPSTICK      Component Value Range   Color, UA yellow     Clarity, UA clear     Glucose, UA neg     Bilirubin, UA neg     Ketones, UA neg     Spec Grav, UA 1.020     Blood, UA neg     pH, UA 5.5     Protein, UA neg     Urobilinogen, UA 0.2     Nitrite, UA neg     Leukocytes, UA Negative     Depo-Medrol 80 IM since she has had a gastric bypass to try and help avoid additional stomach irritation.  I do not believe muscle relaxants necessary  Mobic 7.5 qd if tolerated

## 2012-08-14 NOTE — Patient Instructions (Addendum)
Mobic 7.5 one daily with food for anti-inflammatory effect  Hydrocodone/APAP every 4 hours on an as needed basis only for severe pain  Consider taking Prilosec 20 mg daily for the protection of the stomach.  Return if worse.

## 2012-10-10 ENCOUNTER — Encounter: Payer: Self-pay | Admitting: Internal Medicine

## 2012-11-02 ENCOUNTER — Other Ambulatory Visit (INDEPENDENT_AMBULATORY_CARE_PROVIDER_SITE_OTHER): Payer: Medicare HMO

## 2012-11-02 ENCOUNTER — Ambulatory Visit (INDEPENDENT_AMBULATORY_CARE_PROVIDER_SITE_OTHER): Payer: 59 | Admitting: Internal Medicine

## 2012-11-02 ENCOUNTER — Encounter: Payer: Self-pay | Admitting: Internal Medicine

## 2012-11-02 VITALS — BP 140/74 | HR 116 | Temp 98.4°F | Ht 65.0 in | Wt 255.4 lb

## 2012-11-02 DIAGNOSIS — R1012 Left upper quadrant pain: Secondary | ICD-10-CM

## 2012-11-02 DIAGNOSIS — R10812 Left upper quadrant abdominal tenderness: Secondary | ICD-10-CM

## 2012-11-02 DIAGNOSIS — D509 Iron deficiency anemia, unspecified: Secondary | ICD-10-CM

## 2012-11-02 LAB — COMPREHENSIVE METABOLIC PANEL
ALT: 15 U/L (ref 0–35)
Albumin: 4.1 g/dL (ref 3.5–5.2)
CO2: 27 mEq/L (ref 19–32)
Calcium: 9.1 mg/dL (ref 8.4–10.5)
Chloride: 103 mEq/L (ref 96–112)
GFR: 56.08 mL/min — ABNORMAL LOW (ref >60.00)
Glucose, Bld: 103 mg/dL — ABNORMAL HIGH (ref 70–99)
Sodium: 138 mEq/L (ref 135–145)
Total Bilirubin: 0.6 mg/dL (ref 0.3–1.2)
Total Protein: 7.5 g/dL (ref 6.0–8.3)

## 2012-11-02 LAB — CBC WITH DIFFERENTIAL/PLATELET
Basophils Relative: 0.5 % (ref 0.0–3.0)
Eosinophils Absolute: 0.1 10*3/uL (ref 0.0–0.7)
HCT: 36 % (ref 36.0–46.0)
Lymphs Abs: 2.1 10*3/uL (ref 0.7–4.0)
MCHC: 32.7 g/dL (ref 30.0–36.0)
MCV: 80.2 fl (ref 78.0–100.0)
Monocytes Absolute: 0.8 10*3/uL (ref 0.1–1.0)
Neutro Abs: 8.7 10*3/uL — ABNORMAL HIGH (ref 1.4–7.7)
Neutrophils Relative %: 73.9 % (ref 43.0–77.0)
RBC: 4.49 Mil/uL (ref 3.87–5.11)

## 2012-11-02 LAB — FERRITIN: Ferritin: 8.1 ng/mL — ABNORMAL LOW (ref 10.0–291.0)

## 2012-11-02 MED ORDER — HYDROCODONE-ACETAMINOPHEN 5-500 MG PO TABS: 1.0000 | ORAL_TABLET | ORAL | Status: DC | PRN

## 2012-11-02 NOTE — Progress Notes (Signed)
Subjective:    Patient ID: Felicia Zamora, female    DOB: Jul 18, 1956, 57 y.o.   MRN: [id number]  HPI This is a very nice woman known to me from prior visits for LUQ pain and family hx colon cancer. Last seen in 2010 - colonoscopy was ok with diminutive hyperplastic polyp.  She has had recurrent LUQ pain diagnosed as diverticulitis though no prior documentation. 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 Not sure what transpired after that, Dr. Ezzard Standing of surgery had ordered it.  Since then she has had 3-4 days of LUQ pain a month or every other month that present and start without a clear trigger. There are no other consistent bowel function changes or association with GI function. She does have some nausea but no vomiting. She had scheduled this appointment because of recurrences and happened to develop pain today.  She is frequently constipated. She has been gaining wait as of late.  Allergies  Allergen Reactions  . Codeine     Pain increases instead of decreasing  . Hydrochlorothiazide     Cramps in thighs  . Oxycodone-Acetaminophen   . Paxil (Paroxetine Hcl)     Patient states Paxil keeps her awake  . Sulfonamide Derivatives    Outpatient Prescriptions Prior to Visit  Medication Sig Dispense Refill  . magnesium oxide (MAG-OX 400) 400 MG tablet Take 1 tablet (400 mg total) by mouth 2 (two) times daily.  60 tablet  1  .      . furosemide (LASIX) 20 MG tablet Take 1 tablet (20 mg total) by mouth daily.  30 tablet  3  . HYDROcodone-acetaminophen (VICODIN) 5-500 MG per tablet Take 1 tablet by mouth every 4 (four) hours as needed for pain.  20 tablet  0  .      .       No facility-administered medications prior to visit.   Past Medical History  Diagnosis Date  . Anemia   . Colon polyp   . Diverticulosis   . Arthritis   . Obesity   . Bowel obstruction    Past Surgical History  Procedure Laterality Date  . Gastric bypass  2006  .  Abdominal hysterectomy  07/10/2007  . Spine surgery    . Cesarean section    . Colonoscopy w/ polypectomy      multiple   History   Social History  . Marital Status: Divorced    Spouse Name: N/A    Number of Children: N/A  . Years of Education: N/A   Social History Main Topics  . Smoking status: Never Smoker   . Smokeless tobacco: Never Used  . Alcohol Use: Yes     Comment: 1-2 alcoholic beverages weekly  . Drug Use: No  . Sexually Active: None    Family History  Problem Relation Age of Onset  . Diabetes Mother   . Stomach cancer Mother   . Diabetes Father   . Heart disease Father   . Cirrhosis Brother   . Colon cancer Sister 41  . Colon polyps Sister     Review of Systems + arthritis, pedal edema, back pain Mild urinary frequency lately All other ROS negative    Objective:   Physical Exam General:  Well-developed, well-nourished, mildly uncomfortable, obese Eyes:  anicteric. ENT:   Mouth and posterior pharynx free of lesions.  Neck:   supple w/o thyromegaly or mass.  Lungs: Clear to auscultation bilaterally. Heart:  S1S2,  no rubs, murmurs, gallops. Abdomen:  soft,  Moderately tender in LUQ w/o guarding or rebound, no hepatosplenomegaly, hernia, or mass and BS+.  Lymph:  no cervical or supraclavicular adenopathy. Extremities:   no edema Skin   no rash. Neuro:  A&O x 3.  Psych:  appropriate mood and  Affect.   Data Reviewed: 2010 CT Abd/pelvis Labs in EMR Colonoscopy and pathology    Assessment & Plan:  LUQ pain -  LUQ abdominal tenderness -  Microcytic anemia -  I doubt this is diverticulitis but ? Adhesions or an internal hernia .Anemia probably from malabsorption.   Plan: HYDROcodone-acetaminophen (VICODIN) 5-500 MG per tablet, CBC with Differential, Vitamin B12, Comprehensive metabolic panel, Ferritin, Amylase, Lipase, CT Abdomen Pelvis W Contrast  IO:NGEXB, Loretha Stapler, MD and Ovidio Kin, MD

## 2012-11-02 NOTE — Patient Instructions (Addendum)
Your physician has requested that you go to the basement for lab work before leaving today.  We have faxed your vicodin rx to the pharmacy for you to pick up.  Clear Liquid Diet The clear liquid dietconsists of foods that are liquid or will become liquid at room temperature.You should be able to see through the liquid and beverages. Examples of foods allowed on a clear liquid diet include fruit juice, broth or bouillon, gelatin, or frozen ice pops. The purpose of this diet is to provide necessary fluid, electrolytes such as sodium and potassium, and energy to keep the body functioning during times when you are not able to consume a regular diet.A clear liquid diet should not be continued for long periods of time as it is not nutritionally adequate.  REASONS FOR USING A CLEAR LIQUID DIET  In sudden onset (acute) conditions for a patient before or after surgery.  As the first step in oral feeding.  For fluid and electrolyte replacement in diarrheal diseases.  As a diet before certain medical tests are performed. ADEQUACY The clear liquid diet is adequate only in ascorbic acid, according to the Recommended Dietary Allowances of the [employer]. CHOOSING FOODS Breads and Starches  Allowed:  None are allowed.  Avoid: All are avoided. Vegetables  Allowed:  Strained tomato or vegetable juice.  Avoid: Any others. Fruit  Allowed:  Strained fruit juices and fruit drinks. Include 1 serving of citrus or vitamin C-enriched fruit juice daily.  Avoid: Any others. Meat and Meat Substitutes  Allowed:  None are allowed.  Avoid: All are avoided. Milk  Allowed:  None are allowed.  Avoid: All are avoided. Soups and Combination Foods  Allowed:  Clear bouillon, broth, or strained broth-based soups.  Avoid: Any others. Desserts and Sweets  Allowed:  Sugar, honey. High protein gelatin. Flavored gelatin, ices, or frozen ice pops that do not contain milk.  Avoid: Any  others. Fats and Oils  Allowed:  None are allowed.  Avoid: All are avoided. Beverages  Allowed: Cereal beverages, coffee (regular or decaffeinated), tea, or soda at the discretion of your caregiver.  Avoid: Any others. Condiments  Allowed:  Iodized salt.  Avoid: Any others, including pepper. Supplements  Allowed:  Liquid nutrition beverages.  Avoid: Any others that contain lactose or fiber. SAMPLE MEAL PLAN Breakfast  4 oz (120 mL) strained orange juice.   to 1 cup (125 to 250 mL) gelatin (plain or fortified).  1 cup (250 mL) beverage (coffee or tea).  Sugar, if desired. Midmorning Snack   cup (125 mL) gelatin (plain or fortified). Lunch  1 cup (250 mL) broth or consomm.  4 oz (120 mL) strained grapefruit juice.   cup (125 mL) gelatin (plain or fortified).  1 cup (250 mL) beverage (coffee or tea).  Sugar, if desired. Midafternoon Snack   cup (125 mL) fruit ice.   cup (125 mL) strained fruit juice. Dinner  1 cup (250 mL) broth or consomm.   cup (125 mL) cranberry juice.   cup (125 mL) flavored gelatin (plain or fortified).  1 cup (250 mL) beverage (coffee or tea).  Sugar, if desired. Evening Snack  4 oz (120 mL) strained apple juice (vitamin C-fortified).   cup (125 mL) flavored gelatin (plain or fortified). Document Released: 08/30/2005 Document Revised: 11/22/2011 Document Reviewed: 11/27/2010 [facility] Patient Information 2013 [place], [place].  You have been scheduled for a CT scan of the abdomen and pelvis at [facility] ([address]---this is in  the same building as [employer]).   You are scheduled on 11/03/12 at 9:30am. You should arrive 15 minutes prior to your appointment time for registration. Please follow the written instructions below on the day of your exam:  WARNING: IF YOU ARE ALLERGIC TO IODINE/X-RAY DYE, PLEASE NOTIFY RADIOLOGY IMMEDIATELY AT [phone]! YOU WILL BE GIVEN A 13 HOUR  PREMEDICATION PREP.  1) Do not eat or drink anything after 5:30am (4 hours prior to your test) 2) You have been given 2 bottles of oral contrast to drink. The solution may taste               better if refrigerated, but do NOT add ice or any other liquid to this solution. Shake             well before drinking.    Drink 1 bottle of contrast @ 7:30am (2 hours prior to your exam)  Drink 1 bottle of contrast @ 8:30am (1 hour prior to your exam)  You may take any medications as prescribed with a small amount of water except for the following: Metformin, Glucophage, Glucovance, Avandamet, Riomet, Fortamet, Actoplus Met, Janumet, Glumetza or Metaglip. The above medications must be held the day of the exam AND 48 hours after the exam.  The purpose of you drinking the oral contrast is to aid in the visualization of your intestinal tract. The contrast solution may cause some diarrhea. Before your exam is started, you will be given a small amount of fluid to drink. Depending on your individual set of symptoms, you may also receive an intravenous injection of x-ray contrast/dye. Plan on being at [facility] for 30 minutes or long, depending on the type of exam you are having performed.  This test typically takes 30-45 minutes to complete.  If you have any questions regarding your exam or if you need to reschedule, you may call the CT department at [phone] between the hours of 8:00 am and 5:00 pm, Monday-Friday.  Thank you for choosing me and [facility].  Iva Boop, M.D., [facility]  ________________________________________________________________________

## 2012-11-03 ENCOUNTER — Other Ambulatory Visit: Payer: Self-pay

## 2012-11-03 ENCOUNTER — Ambulatory Visit (INDEPENDENT_AMBULATORY_CARE_PROVIDER_SITE_OTHER)
Admission: RE | Admit: 2012-11-03 | Discharge: 2012-11-03 | Disposition: A | Discharge status: Home / Self Care | Payer: 59 | Source: Ambulatory Visit | Attending: Internal Medicine | Admitting: Internal Medicine

## 2012-11-03 DIAGNOSIS — K5732 Diverticulitis of large intestine without perforation or abscess without bleeding: Secondary | ICD-10-CM

## 2012-11-03 DIAGNOSIS — R10812 Left upper quadrant abdominal tenderness: Secondary | ICD-10-CM

## 2012-11-03 DIAGNOSIS — R1012 Left upper quadrant pain: Secondary | ICD-10-CM

## 2012-11-03 HISTORY — DX: Diverticulitis of large intestine without perforation or abscess without bleeding: K57.32

## 2012-11-03 MED ORDER — IOHEXOL 300 MG/ML  SOLN
100.0000 mL | Freq: Once | INTRAMUSCULAR | Status: AC | PRN
  Administered 2012-11-03: 100 mL via INTRAVENOUS

## 2012-11-03 MED ORDER — AMOXICILLIN-POT CLAVULANATE 875-125 MG PO TABS: 1.0000 | ORAL_TABLET | Freq: Two times a day (BID) | ORAL | Status: DC

## 2012-11-03 NOTE — Progress Notes (Signed)
Quick Note:  She does have diverticulitis - not a post-op problem Please rx Augmentin generic 875 mg bid x 10 days and have her follow-up in 2-3 weeks May advance to full liquids and then a low residue diet  ______

## 2012-11-06 ENCOUNTER — Telehealth: Payer: Self-pay | Admitting: Internal Medicine

## 2012-11-06 NOTE — Telephone Encounter (Signed)
See CT result notes from 11/02/12 for phone call details

## 2012-11-10 ENCOUNTER — Telehealth: Payer: Self-pay

## 2012-11-10 DIAGNOSIS — N63 Unspecified lump in unspecified breast: Secondary | ICD-10-CM

## 2012-11-10 NOTE — Telephone Encounter (Signed)
Pt is requesting a referral for a diagnostic mammogram, she is due for annual but has found a spot that we have not seen her for   Best number [phone]

## 2012-11-11 NOTE — Telephone Encounter (Signed)
Yes, patient needs evaluation. We will be happy to refer patient for diagnostic mammogram, however without evaluation we do not know what to place on the referral for the MMG.

## 2012-11-11 NOTE — Telephone Encounter (Signed)
Does she need to RTC first? 

## 2012-11-13 NOTE — Telephone Encounter (Signed)
Patient needs appt

## 2012-11-13 NOTE — Telephone Encounter (Signed)
Patient does go to [facility] for this not to the breast center. Order put in for the mammogram. She will make appt for this also, to be seen to see if anything additional is needed

## 2012-11-16 NOTE — Telephone Encounter (Signed)
LMOM to CB. 

## 2012-11-16 NOTE — Telephone Encounter (Signed)
I spoke w/Felicia Zamora who stated that she had already put in a referral for a Dx MM and verified w/Donna that she has the referral for [facility]. I called pt back and advised her that the referral has been started for the Dx MM and she should be contacted w/an appt time. Advised her that she can check w/[facility] if she hasn't heard in a couple of days.

## 2012-11-16 NOTE — Telephone Encounter (Signed)
Pt CB and states that she has an appt for a CPE in Apr, but would like to go ahead and get a MM done. She states she is due for a screening MM this month anyway and would like for [place] to put in a referral for one to [facility], but will also call herself to see if she can set up an appt.

## 2012-11-30 ENCOUNTER — Ambulatory Visit (INDEPENDENT_AMBULATORY_CARE_PROVIDER_SITE_OTHER): Payer: 59 | Admitting: Internal Medicine

## 2012-11-30 ENCOUNTER — Encounter: Payer: Self-pay | Admitting: Internal Medicine

## 2012-11-30 VITALS — BP 128/70 | HR 88 | Ht 64.25 in | Wt 247.8 lb

## 2012-11-30 DIAGNOSIS — D508 Other iron deficiency anemias: Secondary | ICD-10-CM

## 2012-11-30 DIAGNOSIS — K5732 Diverticulitis of large intestine without perforation or abscess without bleeding: Secondary | ICD-10-CM

## 2012-11-30 MED ORDER — AMOXICILLIN-POT CLAVULANATE 875-125 MG PO TABS: 1.0000 | ORAL_TABLET | Freq: Two times a day (BID) | ORAL | Status: DC

## 2012-11-30 MED ORDER — FERROUS SULFATE 325 (65 FE) MG PO TABS: 325.0000 mg | ORAL_TABLET | Freq: Every day | ORAL | Status: AC

## 2012-11-30 NOTE — Progress Notes (Signed)
  Subjective:    Patient ID: Felicia Zamora, female    DOB: 1956/07/28, 57 y.o.   MRN: [id number]  HPI She is here after CT dx of sigmoid diverticulitis. Took 10 d of 875 mg amox/clavulanate and was better at day 5. Doing well now. Asking about labs - I had not given results. Hgb 11.8, ferritin 8 Others ok  Medications, allergies, past medical history, past surgical history, family history and social history are reviewed and updated in the EMR.   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 Amox/clavulanate 875 mg bid x 10 days to have on hand and call if recurrence 2. Fe SO4 daily 3. See me prn otherwise

## 2012-11-30 NOTE — Patient Instructions (Addendum)
We are refilling your Augmentin so you will have this on hand to use if you need it for a diverticulitis flare.  If you have to use this please call us and let us know.  Start over the counter Ferrous Sulfate 325mg .  Take one daily.  Thank you for choosing me and [facility].  Iva Boop, M.D., [facility]

## 2012-12-14 ENCOUNTER — Encounter: Payer: Self-pay | Admitting: Internal Medicine

## 2013-01-05 ENCOUNTER — Ambulatory Visit (INDEPENDENT_AMBULATORY_CARE_PROVIDER_SITE_OTHER): Payer: Managed Care, Other (non HMO) | Admitting: Family Medicine

## 2013-01-05 ENCOUNTER — Encounter: Payer: Self-pay | Admitting: Family Medicine

## 2013-01-05 VITALS — BP 137/85 | HR 102 | Temp 98.2°F | Resp 16 | Ht 64.75 in | Wt 240.0 lb

## 2013-01-05 DIAGNOSIS — Z0000 Encounter for general adult medical examination without abnormal findings: Secondary | ICD-10-CM

## 2013-01-05 DIAGNOSIS — Z113 Encounter for screening for infections with a predominantly sexual mode of transmission: Secondary | ICD-10-CM

## 2013-01-05 LAB — POCT URINALYSIS DIPSTICK
Bilirubin, UA: NEGATIVE
Blood, UA: NEGATIVE
Glucose, UA: NEGATIVE
Leukocytes, UA: NEGATIVE
Nitrite, UA: NEGATIVE
Urobilinogen, UA: 0.2

## 2013-01-05 NOTE — Progress Notes (Signed)
Subjective:    Patient ID: Felicia Zamora, female    DOB: 25-Jul-1956, 57 y.o.   MRN: [id number]  HPI  Thsi 57 y.o. Cauc female is here for CPE; she is s/p TAH for menorrhagia (ovaries intact).  Pt has not GYN complaints; she is in a new relationship (not yet intimate) and requests STD  testing. No hx of STDs or high risk behavior.   MMG: UTD  Immunizations: UTD.  CRS: 2008 -negative (Family hx of sibling w/ colon cancer)   PMed and Surg Hx, Soc HX and Fam Hx reviewed.   Review of Systems  Eyes: Positive for redness and itching. Negative for pain, discharge and visual disturbance.       Wears contacts  Cardiovascular: Positive for leg swelling.       Ankles swell- pt sits at desk several hours at work; less edema since started exercising.  All other systems reviewed and are negative.       Objective:   Physical Exam  Nursing note and vitals reviewed. Constitutional: She is oriented to person, place, and time. Vital signs are normal. She appears well-developed and well-nourished. No distress.  HENT:  Head: Normocephalic and atraumatic.  Right Ear: Hearing, tympanic membrane, external ear and ear canal normal.  Left Ear: Hearing, tympanic membrane, external ear and ear canal normal.  Nose: Nose normal. No mucosal edema, rhinorrhea, nasal deformity or septal deviation.  Mouth/Throat: Uvula is midline, oropharynx is clear and moist and mucous membranes are normal. No oral lesions. Normal dentition. No dental caries.  Eyes: Conjunctivae, EOM and lids are normal. Pupils are equal, round, and reactive to light. No scleral icterus.  Fundoscopic exam:      The right eye shows no arteriolar narrowing, no AV nicking and no papilledema. The right eye shows red reflex.       The left eye shows no arteriolar narrowing, no AV nicking and no papilledema. The left eye shows red reflex.  Contacts in place.  Neck: Normal range of motion. Neck supple. No thyromegaly present.  Cardiovascular:  Normal rate, regular rhythm, normal heart sounds and intact distal pulses.  Exam reveals no gallop and no friction rub.   No murmur heard. Pulmonary/Chest: Effort normal and breath sounds normal. No respiratory distress. She has no wheezes. Right breast exhibits no inverted nipple, no mass, no nipple discharge, no skin change and no tenderness. Left breast exhibits no inverted nipple, no mass, no nipple discharge, no skin change and no tenderness. Breasts are symmetrical.  Abdominal: Soft. Bowel sounds are normal. She exhibits no distension, no pulsatile midline mass and no mass. There is no hepatosplenomegaly. There is no tenderness. There is no guarding and no CVA tenderness. No hernia.  Adipose tissue obscures abd masses or abnormalities.  Genitourinary: There is no rash, tenderness or lesion on the right labia. There is no rash, tenderness or lesion on the left labia. Right adnexum displays no mass, no tenderness and no fullness. Left adnexum displays no mass, no tenderness and no fullness. There is erythema around the vagina. No tenderness around the vagina. No signs of injury around the vagina. No vaginal discharge found.  No PAP- pt s/p TAH w/ intact ovaries.Bimanual exam performed; no adnexal masses palpable (adipose tissue makes exam difficult).  Musculoskeletal: Normal range of motion. She exhibits no tenderness.  Trace ankle and pretibial edema.  Lymphadenopathy:       Head (right side): No tonsillar adenopathy present.       Head (left side):  No tonsillar adenopathy present.    She has no cervical adenopathy.    She has no axillary adenopathy.       Right: No inguinal adenopathy present.       Left: No inguinal adenopathy present.  Neurological: She is alert and oriented to person, place, and time. No cranial nerve deficit. She exhibits normal muscle tone. Coordination normal.  Reflex Scores:      Tricep reflexes are 1+ on the right side and 1+ on the left side.      Bicep reflexes are  1+ on the right side and 1+ on the left side.      Patellar reflexes are 1+ on the right side and 1+ on the left side. DTRs difficult to illicit.  Skin: Skin is warm and dry. No rash noted. No erythema. No pallor.  Psychiatric: She has a normal mood and affect. Her behavior is normal. Judgment and thought content normal.     Results for orders placed in visit on 01/05/13  HEPATITIS C ANTIBODY      Result Value Range   HCV Ab NEGATIVE  NEGATIVE  HIV ANTIBODY (ROUTINE TESTING)      Result Value Range   HIV NON REACTIVE  NON REACTIVE  VITAMIN D 25 HYDROXY      Result Value Range   Vit D, 25-Hydroxy 26 (*) 30 - 89 ng/mL  HEPATITIS B SURFACE ANTIBODY, QUANTITATIVE      Result Value Range   Hepatitis B-Post      POCT URINALYSIS DIPSTICK      Result Value Range   Color, UA yellow     Clarity, UA clear     Glucose, UA neg     Bilirubin, UA neg     Ketones, UA 15     Spec Grav, UA <=1.005     Blood, UA neg     pH, UA 6.0     Protein, UA neg     Urobilinogen, UA 0.2     Nitrite, UA neg     Leukocytes, UA Negative          Assessment & Plan:  Routine general medical examination at a health care facility - Plan: POCT urinalysis dipstick, Vitamin D, 25-hydroxy  Screening examination for venereal disease - Plan: Hepatitis C antibody, HIV antibody, Hepatitis B surface antibody

## 2013-01-05 NOTE — Patient Instructions (Addendum)
Keeping You Healthy  Get These Tests  Blood Pressure- Have your blood pressure checked by your healthcare provider at least once a year.  Normal blood pressure is 120/80.  Weight- Have your body mass index (BMI) calculated to screen for obesity.  BMI is a measure of body fat based on height and weight.  You can calculate your own BMI at https://www.west-esparza.com/  Cholesterol- Have your cholesterol checked every year.  Diabetes- Have your blood sugar checked every year if you have high blood pressure, high cholesterol, a family history of diabetes or if you are overweight.  Pap Smear- Have a pap smear every 1 to 3 years if you have been sexually active.  If you are older than 65 and recent pap smears have been normal you may not need additional pap smears.  In addition, if you have had a hysterectomy  For benign disease additional pap smears are not necessary.  Mammogram-Yearly mammograms are essential for early detection of breast cancer  Screening for Colon Cancer- Colonoscopy starting at age 69. Screening may begin sooner depending on your family history and other health conditions.  Follow up colonoscopy as directed by your Gastroenterologist.  Screening for Osteoporosis- Screening begins at age 90 with bone density scanning, sooner if you are at higher risk for developing Osteoporosis.  Get these medicines  Calcium with Vitamin D- Your body requires 1200-1500 mg of Calcium a day and [phone] IU of Vitamin D a day.  You can only absorb 500 mg of Calcium at a time therefore Calcium must be taken in 2 or 3 separate doses throughout the day.  Hormones- Hormone therapy has been associated with increased risk for certain cancers and heart disease.  Talk to your healthcare provider about if you need relief from menopausal symptoms.  Aspirin- Ask your healthcare provider about taking Aspirin to prevent Heart Disease and Stroke.  Get these Immuniztions  Flu shot- Every fall  Pneumonia  shot- Once after the age of 53; if you are younger ask your healthcare provider if you need a pneumonia shot.  Tetanus- Every ten years. You received Tdap in 2012; this is a one-time vaccine. Next Tetanus is due in 2022.  Zostavax- Once after the age of 68 to prevent shingles.  Take these steps  Don't smoke- Your healthcare provider can help you quit. For tips on how to quit, ask your healthcare provider or go to www.smokefree.gov or call 1-800 QUIT-NOW.  Be physically active- Exercise 5 days a week for a minimum of 30 minutes.  If you are not already physically active, start slow and gradually work up to 30 minutes of moderate physical activity.  Try walking, dancing, bike riding, swimming, etc.  Eat a healthy diet- Eat a variety of healthy foods such as fruits, vegetables, whole grains, low fat milk, low fat cheeses, yogurt, lean meats, chicken, fish, eggs, dried beans, tofu, etc.  For more information go to www.thenutritionsource.org  Dental visit- Brush and floss teeth twice daily; visit your dentist twice a year.  Eye exam- Visit your Optometrist or Ophthalmologist yearly.  Drink alcohol in moderation- Limit alcohol intake to one drink or less a day.  Never drink and drive.  Depression- Your emotional health is as important as your physical health.  If you're feeling down or losing interest in things you normally enjoy, please talk to your healthcare provider.  Seat Belts- can save your life; always wear one  Smoke/Carbon Monoxide detectors- These detectors need to be installed on the appropriate  level of your home.  Replace batteries at least once a year.  Violence- If anyone is threatening or hurting you, please tell your healthcare provider.  Living Will/ Health care power of attorney- Discuss with your healthcare provider and family.   For allergic eye symptoms- try OTC Similisan Allergy Eye drops; you can find this in the Eye care section in your pharmacy. Ask the pharmacist  if there is a better product for contact lens wearers.  I have ordered a test to check Vitamin D level; get a Vitamin D3 supplement 1000 IU and take it daily along with the Calcium + D supplement.

## 2013-01-06 ENCOUNTER — Encounter: Payer: Self-pay | Admitting: Family Medicine

## 2013-01-06 LAB — HIV ANTIBODY (ROUTINE TESTING W REFLEX): HIV: NONREACTIVE

## 2013-01-06 LAB — VITAMIN D 25 HYDROXY (VIT D DEFICIENCY, FRACTURES): Vit D, 25-Hydroxy: 26 ng/mL — ABNORMAL LOW (ref 30–89)

## 2013-01-06 NOTE — Progress Notes (Signed)
Quick Note:  Please contact pt and advise that the following labs are abnormal... Vitamin D level is a little below normal; we discussed the additional supplement that needs to taken to get Vitamin D level up into normal range.  I recommend getting the Vitamin D3 2000 IU and take it daily along with Calcium + D supplement.  All other labs/ STD screenings are normal/negative.  Copy to pt. ______

## 2013-01-07 ENCOUNTER — Encounter: Payer: Self-pay | Admitting: Family Medicine

## 2013-01-08 ENCOUNTER — Encounter: Payer: Self-pay | Admitting: Family Medicine

## 2013-01-08 NOTE — Progress Notes (Signed)
Quick Note:  Please contact pt and advise that the following labs are abnormal... Hepatitis B titer is barely detectable; this suggest that you do not have immunity to Hepatitis B. Contact the office if you want to be vaccinated; it is Series of 3 vaccines given over a 6- month period.   [place]oy to pt. ______

## 2013-07-19 ENCOUNTER — Other Ambulatory Visit: Payer: Self-pay

## 2013-11-14 ENCOUNTER — Encounter: Payer: Self-pay | Admitting: Internal Medicine

## 2013-12-17 ENCOUNTER — Encounter: Payer: Self-pay | Admitting: Internal Medicine

## 2014-01-18 ENCOUNTER — Ambulatory Visit (AMBULATORY_SURGERY_CENTER): Payer: Self-pay | Admitting: *Deleted

## 2014-01-18 VITALS — Ht 65.0 in | Wt 253.6 lb

## 2014-01-18 DIAGNOSIS — Z800 Family history of malignant neoplasm of digestive organs: Secondary | ICD-10-CM

## 2014-01-18 MED ORDER — NA SULFATE-K SULFATE-MG SULF 17.5-3.13-1.6 GM/177ML PO SOLN: 1.0000 | Freq: Once | ORAL | Status: DC

## 2014-01-18 NOTE — Progress Notes (Signed)
No allergies to eggs or soy. No problems with anesthesia.  Pt given Emmi instructions for colonoscopy  No oxygen use  No diet drug use  

## 2014-01-31 ENCOUNTER — Ambulatory Visit (AMBULATORY_SURGERY_CENTER): Payer: 59 | Admitting: Internal Medicine

## 2014-01-31 ENCOUNTER — Encounter: Payer: Self-pay | Admitting: Internal Medicine

## 2014-01-31 VITALS — BP 139/75 | HR 84 | Temp 97.5°F | Resp 20 | Ht 65.0 in | Wt 253.0 lb

## 2014-01-31 DIAGNOSIS — Z1211 Encounter for screening for malignant neoplasm of colon: Secondary | ICD-10-CM

## 2014-01-31 DIAGNOSIS — K5730 Diverticulosis of large intestine without perforation or abscess without bleeding: Secondary | ICD-10-CM

## 2014-01-31 DIAGNOSIS — Z800 Family history of malignant neoplasm of digestive organs: Secondary | ICD-10-CM

## 2014-01-31 MED ORDER — SODIUM CHLORIDE 0.9 % IV SOLN: 500.0000 mL | INTRAVENOUS | Status: DC

## 2014-01-31 NOTE — Patient Instructions (Addendum)
No polyps or cancer seen today. You do have diverticulosis - thickened muscle rings and pouches in the colon wall. Please read the handout about this condition.  Next routine colonoscopy in 5 years - 2020  I appreciate the opportunity to care for you. Gatha Mayer, MD, FACG  YOU HAD AN ENDOSCOPIC PROCEDURE TODAY AT [facility]: Refer to the procedure report that was given to you for any specific questions about what was found during the examination.  If the procedure report does not answer your questions, please call your gastroenterologist to clarify.  If you requested that your care partner not be given the details of your procedure findings, then the procedure report has been included in a sealed envelope for you to review at your convenience later.  YOU SHOULD EXPECT: Some feelings of bloating in the abdomen. Passage of more gas than usual.  Walking can help get rid of the air that was put into your GI tract during the procedure and reduce the bloating. If you had a lower endoscopy (such as a colonoscopy or flexible sigmoidoscopy) you may notice spotting of blood in your stool or on the toilet paper. If you underwent a bowel prep for your procedure, then you may not have a normal bowel movement for a few days.  DIET: Your first meal following the procedure should be a light meal and then it is ok to progress to your normal diet.  A half-sandwich or bowl of soup is an example of a good first meal.  Heavy or fried foods are harder to digest and may make you feel nauseous or bloated.  Likewise meals heavy in dairy and vegetables can cause extra gas to form and this can also increase the bloating.  Drink plenty of fluids but you should avoid alcoholic beverages for 24 hours.  ACTIVITY: Your care partner should take you home directly after the procedure.  You should plan to take it easy, moving slowly for the rest of the day.  You can resume normal activity the day after the  procedure however you should NOT DRIVE or use heavy machinery for 24 hours (because of the sedation medicines used during the test).    SYMPTOMS TO REPORT IMMEDIATELY: A gastroenterologist can be reached at any hour.  During normal business hours, 8:30 AM to 5:00 PM Monday through Friday, call [phone].  After hours and on weekends, please call the GI answering service at [phone] who will take a message and have the physician on call contact you.   Following lower endoscopy (colonoscopy or flexible sigmoidoscopy):  Excessive amounts of blood in the stool  Significant tenderness or worsening of abdominal pains  Swelling of the abdomen that is new, acute  Fever of 100F or higher  FOLLOW UP: If any biopsies were taken you will be contacted by phone or by letter within the next 1-3 weeks.  Call your gastroenterologist if you have not heard about the biopsies in 3 weeks.  Our staff will call the home number listed on your records the next business day following your procedure to check on you and address any questions or concerns that you may have at that time regarding the information given to you following your procedure. This is a courtesy call and so if there is no answer at the home number and we have not heard from you through the emergency physician on call, we will assume that you have returned to your regular daily activities without  incident.  SIGNATURES/CONFIDENTIALITY: You and/or your care partner have signed paperwork which will be entered into your electronic medical record.  These signatures attest to the fact that that the information above on your After Visit Summary has been reviewed and is understood.  Full responsibility of the confidentiality of this discharge information lies with you and/or your care-partner.  Diverticulosis-handout given  Repeat colonoscopy in 5 years- 2020 due to family hx.

## 2014-01-31 NOTE — Op Note (Signed)
[place]  [employer]. [place], [id number]   COLONOSCOPY PROCEDURE REPORT  PATIENT: Felicia Zamora, Felicia Zamora  MR#: [id number] BIRTHDATE: [birth year] , 75  yrs. old GENDER: Female ENDOSCOPIST: Gatha Mayer, MD, [facility] PROCEDURE DATE:  01/31/2014 PROCEDURE:   Colonoscopy, screening First Screening Colonoscopy - Avg.  risk and is 50 yrs.  old or older - No.  Prior Negative Screening - Now for repeat screening. Above average risk  History of Adenoma - Now for follow-up colonoscopy & has been > or = to 3 yrs.  N/A  Polyps Removed Today? No.  Recommend repeat exam, <10 yrs? Yes.  High risk (family or personal hx). ASA CLASS:   Class II INDICATIONS:elevated risk screening and Patient's immediate family history of colon cancer. MEDICATIONS: propofol (Diprivan) 300mg  IV, MAC sedation, administered by CRNA, and These medications were titrated to patient response per physician's verbal order  DESCRIPTION OF PROCEDURE:   After the risks benefits and alternatives of the procedure were thoroughly explained, informed consent was obtained.  A digital rectal exam revealed no abnormalities of the rectum.   The LB [id number]  endoscope was introduced through the anus and advanced to the cecum, which was identified by both the appendix and ileocecal valve. No adverse events experienced.   The quality of the prep was Suprep good  The instrument was then slowly withdrawn as the colon was fully examined.  COLON FINDINGS: Severe diverticulosis was noted The finding was in the left colon.   There was mild scattered diverticulosis noted in the right colon.   The colon mucosa was otherwise normal.   A right colon retroflexion was performed.  Retroflexed views revealed no abnormalities. The time to cecum=6 minutes 23 seconds.  Withdrawal time=5 minutes 39 seconds.  The scope was withdrawn and the procedure completed. COMPLICATIONS: There were no complications.  ENDOSCOPIC  IMPRESSION: 1.   Severe diverticulosis was noted in the left colon 2.   There was mild diverticulosis noted in the left colon 3.   The colon mucosa was otherwise normal  RECOMMENDATIONS: Repeat Colonoscopy in 5 years - FHx CRCA sister at 75   eSigned:  Gatha Mayer, MD, [facility] 01/31/2014 2:46 PM   cc: The Patient

## 2014-02-01 ENCOUNTER — Telehealth: Payer: Self-pay

## 2014-02-01 NOTE — Telephone Encounter (Signed)
No answer, left vm 

## 2014-05-18 ENCOUNTER — Ambulatory Visit (INDEPENDENT_AMBULATORY_CARE_PROVIDER_SITE_OTHER): Payer: Managed Care, Other (non HMO) | Admitting: Family Medicine

## 2014-05-18 VITALS — BP 126/82 | HR 97 | Temp 98.1°F | Resp 20 | Ht 65.0 in | Wt 260.5 lb

## 2014-05-18 DIAGNOSIS — R0609 Other forms of dyspnea: Secondary | ICD-10-CM

## 2014-05-18 DIAGNOSIS — G4710 Hypersomnia, unspecified: Secondary | ICD-10-CM

## 2014-05-18 DIAGNOSIS — M25521 Pain in right elbow: Secondary | ICD-10-CM

## 2014-05-18 DIAGNOSIS — R0989 Other specified symptoms and signs involving the circulatory and respiratory systems: Secondary | ICD-10-CM

## 2014-05-18 DIAGNOSIS — R400 Somnolence: Secondary | ICD-10-CM

## 2014-05-18 DIAGNOSIS — E669 Obesity, unspecified: Secondary | ICD-10-CM

## 2014-05-18 DIAGNOSIS — Z808 Family history of malignant neoplasm of other organs or systems: Secondary | ICD-10-CM

## 2014-05-18 DIAGNOSIS — E785 Hyperlipidemia, unspecified: Secondary | ICD-10-CM

## 2014-05-18 DIAGNOSIS — Z800 Family history of malignant neoplasm of digestive organs: Secondary | ICD-10-CM | POA: Insufficient documentation

## 2014-05-18 DIAGNOSIS — D234 Other benign neoplasm of skin of scalp and neck: Secondary | ICD-10-CM

## 2014-05-18 DIAGNOSIS — R7309 Other abnormal glucose: Secondary | ICD-10-CM

## 2014-05-18 DIAGNOSIS — Z862 Personal history of diseases of the blood and blood-forming organs and certain disorders involving the immune mechanism: Secondary | ICD-10-CM

## 2014-05-18 DIAGNOSIS — R739 Hyperglycemia, unspecified: Secondary | ICD-10-CM

## 2014-05-18 DIAGNOSIS — D224 Melanocytic nevi of scalp and neck: Secondary | ICD-10-CM

## 2014-05-18 DIAGNOSIS — Z23 Encounter for immunization: Secondary | ICD-10-CM

## 2014-05-18 DIAGNOSIS — M25529 Pain in unspecified elbow: Secondary | ICD-10-CM

## 2014-05-18 DIAGNOSIS — Z0000 Encounter for general adult medical examination without abnormal findings: Secondary | ICD-10-CM

## 2014-05-18 DIAGNOSIS — R0683 Snoring: Secondary | ICD-10-CM

## 2014-05-18 LAB — POCT GLYCOSYLATED HEMOGLOBIN (HGB A1C): Hemoglobin A1C: 5.5

## 2014-05-18 LAB — GLUCOSE, POCT (MANUAL RESULT ENTRY): POC Glucose: 93 mg/dl (ref 70–99)

## 2014-05-18 NOTE — Patient Instructions (Addendum)
You should receive a call or letter about your lab results within the next week to 10 days.  Discuss contact lens issues with eye care provider. You can try over the counter zyrtec or allegra for possible allergy.  Exercise 150 minutes per week - walking to begin with.  We will refer you for sleep study. We will refer you to dermatologist for evaluation of area on scalp, as father had skin cancer.  See exercises about the shoulder pain- tylenol if needed.  If this persists -recheck for possible xrays in next few weeks.  Return to the clinic or go to the nearest emergency room if any of your symptoms worsen or new symptoms occur.   Keeping You Healthy  Get These Tests  Blood Pressure- Have your blood pressure checked by your healthcare provider at least once a year.  Normal blood pressure is 120/80.  Weight- Have your body mass index (BMI) calculated to screen for obesity.  BMI is a measure of body fat based on height and weight.  You can calculate your own BMI at [employer]  Cholesterol- Have your cholesterol checked every year.  Diabetes- Have your blood sugar checked every year if you have high blood pressure, high cholesterol, a family history of diabetes or if you are overweight.  Pap Smear- Have a pap smear every 1 to 3 years if you have been sexually active.  If you are older than 65 and recent pap smears have been normal you may not need additional pap smears.  In addition, if you have had a hysterectomy  For benign disease additional pap smears are not necessary.  Mammogram-Yearly mammograms are essential for early detection of breast cancer  - * call to schedule this at the facility where last one performed.   Screening for Colon Cancer- Colonoscopy starting at age 77. Screening may begin sooner depending on your family history and other health conditions.  Follow up colonoscopy as directed by your Gastroenterologist.  Screening for Osteoporosis- Screening begins at  age 22 with bone density scanning, sooner if you are at higher risk for developing Osteoporosis.  Get these medicines  Calcium with Vitamin D- Your body requires 1200-1500 mg of Calcium a day and [phone] IU of Vitamin D a day.  You can only absorb 500 mg of Calcium at a time therefore Calcium must be taken in 2 or 3 separate doses throughout the day.  Hormones- Hormone therapy has been associated with increased risk for certain cancers and heart disease.  Talk to your healthcare provider about if you need relief from menopausal symptoms.  Aspirin- Ask your healthcare provider about taking Aspirin to prevent Heart Disease and Stroke.  Get these Immuniztions  Flu shot- Every fall  Pneumonia shot- Once after the age of 23; if you are younger ask your healthcare provider if you need a pneumonia shot.  Tetanus- Every ten years.  Zostavax- Once after the age of 17 to prevent shingles.  Take these steps  Don't smoke- Your healthcare provider can help you quit. For tips on how to quit, ask your healthcare provider or go to www.smokefree.gov or call 1-800 QUIT-NOW.  Be physically active- Exercise 5 days a week for a minimum of 30 minutes.  If you are not already physically active, start slow and gradually work up to 30 minutes of moderate physical activity.  Try walking, dancing, bike riding, swimming, etc.  Eat a healthy diet- Eat a variety of healthy foods such as fruits, vegetables, whole grains, low  fat milk, low fat cheeses, yogurt, lean meats, chicken, fish, eggs, dried beans, tofu, etc.  For more information go to www.thenutritionsource.org  Dental visit- Brush and floss teeth twice daily; visit your dentist twice a year.  Eye exam- Visit your Optometrist or Ophthalmologist yearly.  Drink alcohol in moderation- Limit alcohol intake to one drink or less a day.  Never drink and drive.  Depression- Your emotional health is as important as your physical health.  If you're feeling down or  losing interest in things you normally enjoy, please talk to your healthcare provider.  Seat Belts- can save your life; always wear one  Smoke/Carbon Monoxide detectors- These detectors need to be installed on the appropriate level of your home.  Replace batteries at least once a year.  Violence- If anyone is threatening or hurting you, please tell your healthcare provider.  Living Will/ Health care power of attorney- Discuss with your healthcare provider and family.  Impingement Syndrome, Rotator Cuff, Bursitis with Rehab Impingement syndrome is a condition that involves inflammation of the tendons of the rotator cuff and the subacromial bursa, that causes pain in the shoulder. The rotator cuff consists of four tendons and muscles that control much of the shoulder and upper arm function. The subacromial bursa is a fluid filled sac that helps reduce friction between the rotator cuff and one of the bones of the shoulder (acromion). Impingement syndrome is usually an overuse injury that causes swelling of the bursa (bursitis), swelling of the tendon (tendonitis), and/or a tear of the tendon (strain). Strains are classified into three categories. Grade 1 strains cause pain, but the tendon is not lengthened. Grade 2 strains include a lengthened ligament, due to the ligament being stretched or partially ruptured. With grade 2 strains there is still function, although the function may be decreased. Grade 3 strains include a complete tear of the tendon or muscle, and function is usually impaired. SYMPTOMS   Pain around the shoulder, often at the outer portion of the upper arm.  Pain that gets worse with shoulder function, especially when reaching overhead or lifting.  Sometimes, aching when not using the arm.  Pain that wakes you up at night.  Sometimes, tenderness, swelling, warmth, or redness over the affected area.  Loss of strength.  Limited motion of the shoulder, especially reaching behind  the back (to the back pocket or to unhook bra) or across your body.  Crackling sound (crepitation) when moving the arm.  Biceps tendon pain and inflammation (in the front of the shoulder). Worse when bending the elbow or lifting. CAUSES  Impingement syndrome is often an overuse injury, in which chronic (repetitive) motions cause the tendons or bursa to become inflamed. A strain occurs when a force is paced on the tendon or muscle that is greater than it can withstand. Common mechanisms of injury include: Stress from sudden increase in duration, frequency, or intensity of training.  Direct hit (trauma) to the shoulder.  Aging, erosion of the tendon with normal use.  Bony bump on shoulder (acromial spur). RISK INCREASES WITH:  Contact sports (football, wrestling, boxing).  Throwing sports (baseball, tennis, volleyball).  Weightlifting and bodybuilding.  Heavy labor.  Previous injury to the rotator cuff, including impingement.  Poor shoulder strength and flexibility.  Failure to warm up properly before activity.  Inadequate protective equipment.  Old age.  Bony bump on shoulder (acromial spur). PREVENTION   Warm up and stretch properly before activity.  Allow for adequate recovery between workouts.  Maintain  physical fitness:  Strength, flexibility, and endurance.  Cardiovascular fitness.  Learn and use proper exercise technique. PROGNOSIS  If treated properly, impingement syndrome usually goes away within 6 weeks. Sometimes surgery is required.  RELATED COMPLICATIONS   Longer healing time if not properly treated, or if not given enough time to heal.  Recurring symptoms, that result in a chronic condition.  Shoulder stiffness, frozen shoulder, or loss of motion.  Rotator cuff tendon tear.  Recurring symptoms, especially if activity is resumed too soon, with overuse, with a direct blow, or when using poor technique. TREATMENT  Treatment first involves the use  of ice and medicine, to reduce pain and inflammation. The use of strengthening and stretching exercises may help reduce pain with activity. These exercises may be performed at home or with a therapist. If non-surgical treatment is unsuccessful after more than 6 months, surgery may be advised. After surgery and rehabilitation, activity is usually possible in 3 months.  MEDICATION  If pain medicine is needed, nonsteroidal anti-inflammatory medicines (aspirin and ibuprofen), or other minor pain relievers (acetaminophen), are often advised.  Do not take pain medicine for 7 days before surgery.  Prescription pain relievers may be given, if your caregiver thinks they are needed. Use only as directed and only as much as you need.  Corticosteroid injections may be given by your caregiver. These injections should be reserved for the most serious cases, because they may only be given a certain number of times. HEAT AND COLD  Cold treatment (icing) should be applied for 10 to 15 minutes every 2 to 3 hours for inflammation and pain, and immediately after activity that aggravates your symptoms. Use ice packs or an ice massage.  Heat treatment may be used before performing stretching and strengthening activities prescribed by your caregiver, physical therapist, or athletic trainer. Use a heat pack or a warm water soak. SEEK MEDICAL CARE IF:   Symptoms get worse or do not improve in 4 to 6 weeks, despite treatment.  New, unexplained symptoms develop. (Drugs used in treatment may produce side effects.) EXERCISES  RANGE OF MOTION (ROM) AND STRETCHING EXERCISES - Impingement Syndrome (Rotator Cuff  Tendinitis, Bursitis) These exercises may help you when beginning to rehabilitate your injury. Your symptoms may go away with or without further involvement from your physician, physical therapist or athletic trainer. While completing these exercises, remember:   Restoring tissue flexibility helps normal motion to  return to the joints. This allows healthier, less painful movement and activity.  An effective stretch should be held for at least 30 seconds.  A stretch should never be painful. You should only feel a gentle lengthening or release in the stretched tissue. STRETCH - Flexion, Standing  Stand with good posture. With an underhand grip on your right / left hand, and an overhand grip on the opposite hand, grasp a broomstick or cane so that your hands are a little more than shoulder width apart.  Keeping your right / left elbow straight and shoulder muscles relaxed, push the stick with your opposite hand, to raise your right / left arm in front of your body and then overhead. Raise your arm until you feel a stretch in your right / left shoulder, but before you have increased shoulder pain.  Try to avoid shrugging your right / left shoulder as your arm rises, by keeping your shoulder blade tucked down and toward your mid-back spine. Hold for __________ seconds.  Slowly return to the starting position. Repeat __________ times. Complete  this exercise __________ times per day. STRETCH - Abduction, Supine  Lie on your back. With an underhand grip on your right / left hand and an overhand grip on the opposite hand, grasp a broomstick or cane so that your hands are a little more than shoulder width apart.  Keeping your right / left elbow straight and your shoulder muscles relaxed, push the stick with your opposite hand, to raise your right / left arm out to the side of your body and then overhead. Raise your arm until you feel a stretch in your right / left shoulder, but before you have increased shoulder pain.  Try to avoid shrugging your right / left shoulder as your arm rises, by keeping your shoulder blade tucked down and toward your mid-back spine. Hold for __________ seconds.  Slowly return to the starting position. Repeat __________ times. Complete this exercise __________ times per day. ROM -  Flexion, Active-Assisted  Lie on your back. You may bend your knees for comfort.  Grasp a broomstick or cane so your hands are about shoulder width apart. Your right / left hand should grip the end of the stick, so that your hand is positioned "thumbs-up," as if you were about to shake hands.  Using your healthy arm to lead, raise your right / left arm overhead, until you feel a gentle stretch in your shoulder. Hold for __________ seconds.  Use the stick to assist in returning your right / left arm to its starting position. Repeat __________ times. Complete this exercise __________ times per day.  ROM - Internal Rotation, Supine   Lie on your back on a firm surface. Place your right / left elbow about 60 degrees away from your side. Elevate your elbow with a folded towel, so that the elbow and shoulder are the same height.  Using a broomstick or cane and your strong arm, pull your right / left hand toward your body until you feel a gentle stretch, but no increase in your shoulder pain. Keep your shoulder and elbow in place throughout the exercise.  Hold for __________ seconds. Slowly return to the starting position. Repeat __________ times. Complete this exercise __________ times per day. STRETCH - Internal Rotation  Place your right / left hand behind your back, palm up.  Throw a towel or belt over your opposite shoulder. Grasp the towel with your right / left hand.  While keeping an upright posture, gently pull up on the towel, until you feel a stretch in the front of your right / left shoulder.  Avoid shrugging your right / left shoulder as your arm rises, by keeping your shoulder blade tucked down and toward your mid-back spine.  Hold for __________ seconds. Release the stretch, by lowering your healthy hand. Repeat __________ times. Complete this exercise __________ times per day. ROM - Internal Rotation   Using an underhand grip, grasp a stick behind your back with both  hands.  While standing upright with good posture, slide the stick up your back until you feel a mild stretch in the front of your shoulder.  Hold for __________ seconds. Slowly return to your starting position. Repeat __________ times. Complete this exercise __________ times per day.  STRETCH - Posterior Shoulder Capsule   Stand or sit with good posture. Grasp your right / left elbow and draw it across your chest, keeping it at the same height as your shoulder.  Pull your elbow, so your upper arm comes in closer to your chest. Pull  until you feel a gentle stretch in the back of your shoulder.  Hold for __________ seconds. Repeat __________ times. Complete this exercise __________ times per day. STRENGTHENING EXERCISES - Impingement Syndrome (Rotator Cuff Tendinitis, Bursitis) These exercises may help you when beginning to rehabilitate your injury. They may resolve your symptoms with or without further involvement from your physician, physical therapist or athletic trainer. While completing these exercises, remember:  Muscles can gain both the endurance and the strength needed for everyday activities through controlled exercises.  Complete these exercises as instructed by your physician, physical therapist or athletic trainer. Increase the resistance and repetitions only as guided.  You may experience muscle soreness or fatigue, but the pain or discomfort you are trying to eliminate should never worsen during these exercises. If this pain does get worse, stop and make sure you are following the directions exactly. If the pain is still present after adjustments, discontinue the exercise until you can discuss the trouble with your clinician.  During your recovery, avoid activity or exercises which involve actions that place your injured hand or elbow above your head or behind your back or head. These positions stress the tissues which you are trying to heal. STRENGTH - Scapular Depression and  Adduction   With good posture, sit on a firm chair. Support your arms in front of you, with pillows, arm rests, or on a table top. Have your elbows in line with the sides of your body.  Gently draw your shoulder blades down and toward your mid-back spine. Gradually increase the tension, without tensing the muscles along the top of your shoulders and the back of your neck.  Hold for __________ seconds. Slowly release the tension and relax your muscles completely before starting the next repetition.  After you have practiced this exercise, remove the arm support and complete the exercise in standing as well as sitting position. Repeat __________ times. Complete this exercise __________ times per day.  STRENGTH - Shoulder Abductors, Isometric  With good posture, stand or sit about 4-6 inches from a wall, with your right / left side facing the wall.  Bend your right / left elbow. Gently press your right / left elbow into the wall. Increase the pressure gradually, until you are pressing as hard as you can, without shrugging your shoulder or increasing any shoulder discomfort.  Hold for __________ seconds.  Release the tension slowly. Relax your shoulder muscles completely before you begin the next repetition. Repeat __________ times. Complete this exercise __________ times per day.  STRENGTH - External Rotators, Isometric  Keep your right / left elbow at your side and bend it 90 degrees.  Step into a door frame so that the outside of your right / left wrist can press against the door frame without your upper arm leaving your side.  Gently press your right / left wrist into the door frame, as if you were trying to swing the back of your hand away from your stomach. Gradually increase the tension, until you are pressing as hard as you can, without shrugging your shoulder or increasing any shoulder discomfort.  Hold for __________ seconds.  Release the tension slowly. Relax your shoulder  muscles completely before you begin the next repetition. Repeat __________ times. Complete this exercise __________ times per day.  STRENGTH - Supraspinatus   Stand or sit with good posture. Grasp a __________ weight, or an exercise band or tubing, so that your hand is "thumbs-up," like you are shaking hands.  Slowly lift  your right / left arm in a "V" away from your thigh, diagonally into the space between your side and straight ahead. Lift your hand to shoulder height or as far as you can, without increasing any shoulder pain. At first, many people do not lift their hands above shoulder height.  Avoid shrugging your right / left shoulder as your arm rises, by keeping your shoulder blade tucked down and toward your mid-back spine.  Hold for __________ seconds. Control the descent of your hand, as you slowly return to your starting position. Repeat __________ times. Complete this exercise __________ times per day.  STRENGTH - External Rotators  Secure a rubber exercise band or tubing to a fixed object (table, pole) so that it is at the same height as your right / left elbow when you are standing or sitting on a firm surface.  Stand or sit so that the secured exercise band is at your uninjured side.  Bend your right / left elbow 90 degrees. Place a folded towel or small pillow under your right / left arm, so that your elbow is a few inches away from your side.  Keeping the tension on the exercise band, pull it away from your body, as if pivoting on your elbow. Be sure to keep your body steady, so that the movement is coming only from your rotating shoulder.  Hold for __________ seconds. Release the tension in a controlled manner, as you return to the starting position. Repeat __________ times. Complete this exercise __________ times per day.  STRENGTH - Internal Rotators   Secure a rubber exercise band or tubing to a fixed object (table, pole) so that it is at the same height as your right /  left elbow when you are standing or sitting on a firm surface.  Stand or sit so that the secured exercise band is at your right / left side.  Bend your elbow 90 degrees. Place a folded towel or small pillow under your right / left arm so that your elbow is a few inches away from your side.  Keeping the tension on the exercise band, pull it across your body, toward your stomach. Be sure to keep your body steady, so that the movement is coming only from your rotating shoulder.  Hold for __________ seconds. Release the tension in a controlled manner, as you return to the starting position. Repeat __________ times. Complete this exercise __________ times per day.  STRENGTH - Scapular Protractors, Standing   Stand arms length away from a wall. Place your hands on the wall, keeping your elbows straight.  Begin by dropping your shoulder blades down and toward your mid-back spine.  To strengthen your protractors, keep your shoulder blades down, but slide them forward on your rib cage. It will feel as if you are lifting the back of your rib cage away from the wall. This is a subtle motion and can be challenging to complete. Ask your caregiver for further instruction, if you are not sure you are doing the exercise correctly.  Hold for __________ seconds. Slowly return to the starting position, resting the muscles completely before starting the next repetition. Repeat __________ times. Complete this exercise __________ times per day. STRENGTH - Scapular Protractors, Supine  Lie on your back on a firm surface. Extend your right / left arm straight into the air while holding a __________ weight in your hand.  Keeping your head and back in place, lift your shoulder off the floor.  Hold for  __________ seconds. Slowly return to the starting position, and allow your muscles to relax completely before starting the next repetition. Repeat __________ times. Complete this exercise __________ times per  day. STRENGTH - Scapular Protractors, Quadruped  Get onto your hands and knees, with your shoulders directly over your hands (or as close as you can be, comfortably).  Keeping your elbows locked, lift the back of your rib cage up into your shoulder blades, so your mid-back rounds out. Keep your neck muscles relaxed.  Hold this position for __________ seconds. Slowly return to the starting position and allow your muscles to relax completely before starting the next repetition. Repeat __________ times. Complete this exercise __________ times per day.  STRENGTH - Scapular Retractors  Secure a rubber exercise band or tubing to a fixed object (table, pole), so that it is at the height of your shoulders when you are either standing, or sitting on a firm armless chair.  With a palm down grip, grasp an end of the band in each hand. Straighten your elbows and lift your hands straight in front of you, at shoulder height. Step back, away from the secured end of the band, until it becomes tense.  Squeezing your shoulder blades together, draw your elbows back toward your sides, as you bend them. Keep your upper arms lifted away from your body throughout the exercise.  Hold for __________ seconds. Slowly ease the tension on the band, as you reverse the directions and return to the starting position. Repeat __________ times. Complete this exercise __________ times per day. STRENGTH - Shoulder Extensors   Secure a rubber exercise band or tubing to a fixed object (table, pole) so that it is at the height of your shoulders when you are either standing, or sitting on a firm armless chair.  With a thumbs-up grip, grasp an end of the band in each hand. Straighten your elbows and lift your hands straight in front of you, at shoulder height. Step back, away from the secured end of the band, until it becomes tense.  Squeezing your shoulder blades together, pull your hands down to the sides of your thighs. Do not  allow your hands to go behind you.  Hold for __________ seconds. Slowly ease the tension on the band, as you reverse the directions and return to the starting position. Repeat __________ times. Complete this exercise __________ times per day.  STRENGTH - Scapular Retractors and External Rotators   Secure a rubber exercise band or tubing to a fixed object (table, pole) so that it is at the height as your shoulders, when you are either standing, or sitting on a firm armless chair.  With a palm down grip, grasp an end of the band in each hand. Bend your elbows 90 degrees and lift your elbows to shoulder height, at your sides. Step back, away from the secured end of the band, until it becomes tense.  Squeezing your shoulder blades together, rotate your shoulders so that your upper arms and elbows remain stationary, but your fists travel upward to head height.  Hold for __________ seconds. Slowly ease the tension on the band, as you reverse the directions and return to the starting position. Repeat __________ times. Complete this exercise __________ times per day.  STRENGTH - Scapular Retractors and External Rotators, Rowing   Secure a rubber exercise band or tubing to a fixed object (table, pole) so that it is at the height of your shoulders, when you are either standing, or sitting on  a firm armless chair.  With a palm down grip, grasp an end of the band in each hand. Straighten your elbows and lift your hands straight in front of you, at shoulder height. Step back, away from the secured end of the band, until it becomes tense.  Step 1: Squeeze your shoulder blades together. Bending your elbows, draw your hands to your chest, as if you are rowing a boat. At the end of this motion, your hands and elbow should be at shoulder height and your elbows should be out to your sides.  Step 2: Rotate your shoulders, to raise your hands above your head. Your forearms should be vertical and your upper arms  should be horizontal.  Hold for __________ seconds. Slowly ease the tension on the band, as you reverse the directions and return to the starting position. Repeat __________ times. Complete this exercise __________ times per day.  STRENGTH - Scapular Depressors  Find a sturdy chair without wheels, such as a dining room chair.  Keeping your feet on the floor, and your hands on the chair arms, lift your bottom up from the seat, and lock your elbows.  Keeping your elbows straight, allow gravity to pull your body weight down. Your shoulders will rise toward your ears.  Raise your body against gravity by drawing your shoulder blades down your back, shortening the distance between your shoulders and ears. Although your feet should always maintain contact with the floor, your feet should progressively support less body weight, as you get stronger.  Hold for __________ seconds. In a controlled and slow manner, lower your body weight to begin the next repetition. Repeat __________ times. Complete this exercise __________ times per day.  Document Released: 08/30/2005 Document Revised: 11/22/2011 Document Reviewed: 12/12/2008 [facility] Patient Information 2015 [place], [place]. This information is not intended to replace advice given to you by your health care provider. Make sure you discuss any questions you have with your health care provider.

## 2014-05-18 NOTE — Progress Notes (Addendum)
Subjective:  This chart was scribed for Wendie Agreste, MD by Mercy Moore, Medial Scribe. This patient was seen in room 1 and the patient's care was started at 2:25 PM.    Patient ID: Felicia Zamora, female    DOB: [birth year], 58 y.o.   MRN: [id number]  HPI HPI Comments: Felicia Zamora is a 58 y.o. female who presents to the [facility] requesting complete physical exam for her insurance. Patient was last seen by Dr. Leward Quan for physical 12/2012. STI testing done at visit including HIV and Hep C, with negative results.  PCP:  Patient states that she has been coming here for years, but she does not have a primary care physician.    1 Health Maintanence: Colonoscopy in 01/2014. Results: Diverticulosis noted, but otherwise normal. Repeat in five years due to family history CRCA in sister at 34 years old. Mammogram in 11/2012. She has not received mammogram this year. Repeat in one year. Immunizations: T-Dap 01/2011. Pap screening: s/p TAH for menorrhagia. Patient has not had a seasonal flu shot this year. She agrees to receive vaccination today. Patient states that she does not need STI testing. She denies new sexual partners nor has she been sexually active.  Ophthalmologist: Patient has not seen an eye doctor this year, but she has an upcoming appointment scheduled. Dentist: Patient reports regular dental visits twice a year.  Exercise: Patient does not exercise, stating that she knows she should.   2 Hyperglycemia: elevated in past. Patient has never been diagnosed with Diabetes. Patient shares family history of Diabetes in her parents and multiple siblings.   Summary of today's complaints: Patient reports eye irritation attributed to wearing her contact lens. Patient reports congestion and post nasal drip that she has been experiencing for months now. Patient reports a growth above her left eyebrow. Family history of skin cancer in her father. Patient has not been  evaluated by a dermatologist. Patient reports right arm pain exacerbated with lifting the limb. She reports intermittent pain for several months now. However last night her pain was so severe she took an aspirin. Patient states that she that usually she just deals with the pain without any medication. Patient denies direct injury or causative trauma for arm pain. Patient is left hand dominant. 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 Patient states that her husband has noticed snoring and apneic events during her sleep. Patient denies fatigue during the day, but she does drink caffeinated drinks.   Patient shares history of gastric bypass in 2006 performed at [facility]; she is unable to recall her surgeon. Patient reports lowest weight at 190 lbs. Since her surgery she reports that her weight has fluctuated, but she had never achieved 190 lbs again.   Patient Active Problem List   Diagnosis Date Noted  . Morbid obesity 01/05/2013  . Diverticulitis of colon (without mention of hemorrhage) 11/03/2012  . ANEMIA, UNSPECIFIED 10/31/2008  . PERSONAL HISTORY OF FAILED MODERATE SEDATION 10/31/2008   Past Medical History  Diagnosis Date  . Anemia   . Colon polyp   . Diverticulosis   . Arthritis   . Obesity   . Bowel obstruction   . Diverticulitis of colon (without mention of hemorrhage) 11/03/2012   Past Surgical History  Procedure Laterality Date  . Gastric bypass  2006  . Abdominal hysterectomy  07/10/2007    Diagnosis: menorrhagia  . Spine surgery  2006    C-spine herniated disc  .  Cesarean section    . Colonoscopy w/ polypectomy      multiple  . Tubal ligation     Allergies  Allergen Reactions  . Codeine     Pain increases instead of decreasing  . Hydrochlorothiazide     Cramps in thighs  . Oxycodone-Acetaminophen   . Paxil [Paroxetine Hcl]     Patient states Paxil keeps her awake  . Sulfonamide Derivatives     Per pt: unknown   Prior to Admission medications     Medication Sig Start Date End Date Taking? Authorizing Provider  B Complex Vitamins (VITAMIN-B COMPLEX PO) Take 1 capsule by mouth daily.   Yes Historical Provider, MD  CALCIUM PO Take 1 capsule by mouth 2 (two) times daily.   Yes Historical Provider, MD  ferrous sulfate (FERROUSUL) 325 (65 FE) MG tablet Take 1 tablet (325 mg total) by mouth daily with breakfast. 11/30/12  Yes Gatha Mayer, MD  MAGNESIUM PO Take by mouth daily.   Yes Historical Provider, MD  Multiple Vitamin (MULTIVITAMIN) tablet Take 1 tablet by mouth daily.   Yes Historical Provider, MD  POTASSIUM PO Take 1 capsule by mouth daily.   Yes Historical Provider, MD  VITAMIN D, CHOLECALCIFEROL, PO Take by mouth daily.   Yes Historical Provider, MD  Omega-3 Fatty Acids (FISH OIL PO) Take 1 capsule by mouth daily.    Historical Provider, MD   History   Social History  . Marital Status: Divorced    Spouse Name: N/A    Number of Children: N/A  . Years of Education: N/A   Occupational History  . Not on file.   Social History Main Topics  . Smoking status: Never Smoker   . Smokeless tobacco: Never Used  . Alcohol Use: Yes     Comment: 2 glasses wine a month  . Drug Use: No  . Sexual Activity: Not on file   Other Topics Concern  . Not on file   Social History Narrative  . No narrative on file    Review of Systems 13 point ROS per patient self survey reviewed. Negative other than noted above.      Objective:   Physical Exam  Nursing note and vitals reviewed. Abdominal: There is no tenderness.  Musculoskeletal:  Right shoulder: Full ROM but pain with terminal flexion and abduction. Negative neer. Positive Hawkin's. Pain with empty can with full RTC strength.   Skin: No rash noted. No erythema.  Approximatly 5 ml slightly elevated flesh color lesion over left eyebrow with scaling. No surrounding erythema.     Filed Vitals:   05/18/14 1336  BP: 126/82  Pulse: 97  Temp: 98.1 F (36.7 C)  TempSrc: Oral   Resp: 20  Height: 5\' 5"  (1.651 m)  Weight: 260 lb 8 oz (118.162 kg)  SpO2: 98%    Visual Acuity Screening   Right eye Left eye Both eyes  Without correction:     With correction: 20/40 20/20 20/20     Results for orders placed in visit on 05/18/14  POCT GLYCOSYLATED HEMOGLOBIN (HGB A1C)      Result Value Ref Range   Hemoglobin A1C 5.5    GLUCOSE, POCT (MANUAL RESULT ENTRY)      Result Value Ref Range   POC Glucose 93  70 - 99 mg/dl        Assessment & Plan:  Felicia Zamora is a 58 y.o. female Annual physical exam - Plan: Comprehensive metabolic panel  --anticipatory guidance as  below in AVS, screening labs above. Health maintenance items as above in HPI discussed/recommended as applicable. She will schedule MMG. otc antihistamine for allergic sx's.   Family history of colorectal cancer  -- utd on colonoscopy.   Obesity  - exercise, diet control discussed.   Snoring, Daytime somnolence - Plan: Ambulatory referral to Sleep Studies to r/o OSA.   Hyperglycemia - Plan: POCT glycosylated hemoglobin (Hb A1C), POCT glucose (manual entry)  -improved - cont diet and exercise as discussed.   Nevus of scalp, Family history of skin cancer - Plan: Ambulatory referral to Dermatology  - small area, but ddx of SCC with FH of skin CA and scaled/thickened appearance. Refer to derm for eval/bx.   Pain in joint, upper arm, right  -suspected RTC tendinosis or bursitis.  Trial of HEP, tylenol, recheck if not improving in next few weeks.   Hyperlipidemia - Plan: Comprehensive metabolic panel, Lipid panel pending.   Flu vaccine need - Plan: Flu Vaccine QUAD 36+ mos IM given.    No orders of the defined types were placed in this encounter.   Patient Instructions  You should receive a call or letter about your lab results within the next week to 10 days.  Discuss contact lens issues with eye care provider. You can try over the counter zyrtec or allegra for possible allergy.  Exercise 150  minutes per week - walking to begin with.  We will refer you for sleep study. We will refer you to dermatologist for evaluation of area on scalp, as father had skin cancer.  See exercises about the shoulder pain- tylenol if needed.  If this persists -recheck for possible xrays in next few weeks.  Return to the clinic or go to the nearest emergency room if any of your symptoms worsen or new symptoms occur.   Keeping You Healthy  Get These Tests  Blood Pressure- Have your blood pressure checked by your healthcare provider at least once a year.  Normal blood pressure is 120/80.  Weight- Have your body mass index (BMI) calculated to screen for obesity.  BMI is a measure of body fat based on height and weight.  You can calculate your own BMI at [employer]  Cholesterol- Have your cholesterol checked every year.  Diabetes- Have your blood sugar checked every year if you have high blood pressure, high cholesterol, a family history of diabetes or if you are overweight.  Pap Smear- Have a pap smear every 1 to 3 years if you have been sexually active.  If you are older than 65 and recent pap smears have been normal you may not need additional pap smears.  In addition, if you have had a hysterectomy  For benign disease additional pap smears are not necessary.  Mammogram-Yearly mammograms are essential for early detection of breast cancer  - * call to schedule this at the facility where last one performed.   Screening for Colon Cancer- Colonoscopy starting at age 15. Screening may begin sooner depending on your family history and other health conditions.  Follow up colonoscopy as directed by your Gastroenterologist.  Screening for Osteoporosis- Screening begins at age 53 with bone density scanning, sooner if you are at higher risk for developing Osteoporosis.  Get these medicines  Calcium with Vitamin D- Your body requires 1200-1500 mg of Calcium a day and [phone] IU of Vitamin D a  day.  You can only absorb 500 mg of Calcium at a time therefore Calcium must be taken in 2 or  3 separate doses throughout the day.  Hormones- Hormone therapy has been associated with increased risk for certain cancers and heart disease.  Talk to your healthcare provider about if you need relief from menopausal symptoms.  Aspirin- Ask your healthcare provider about taking Aspirin to prevent Heart Disease and Stroke.  Get these Immuniztions  Flu shot- Every fall  Pneumonia shot- Once after the age of 59; if you are younger ask your healthcare provider if you need a pneumonia shot.  Tetanus- Every ten years.  Zostavax- Once after the age of 51 to prevent shingles.  Take these steps  Don't smoke- Your healthcare provider can help you quit. For tips on how to quit, ask your healthcare provider or go to www.smokefree.gov or call 1-800 QUIT-NOW.  Be physically active- Exercise 5 days a week for a minimum of 30 minutes.  If you are not already physically active, start slow and gradually work up to 30 minutes of moderate physical activity.  Try walking, dancing, bike riding, swimming, etc.  Eat a healthy diet- Eat a variety of healthy foods such as fruits, vegetables, whole grains, low fat milk, low fat cheeses, yogurt, lean meats, chicken, fish, eggs, dried beans, tofu, etc.  For more information go to www.thenutritionsource.org  Dental visit- Brush and floss teeth twice daily; visit your dentist twice a year.  Eye exam- Visit your Optometrist or Ophthalmologist yearly.  Drink alcohol in moderation- Limit alcohol intake to one drink or less a day.  Never drink and drive.  Depression- Your emotional health is as important as your physical health.  If you're feeling down or losing interest in things you normally enjoy, please talk to your healthcare provider.  Seat Belts- can save your life; always wear one  Smoke/Carbon Monoxide detectors- These detectors need to be installed on the  appropriate level of your home.  Replace batteries at least once a year.  Violence- If anyone is threatening or hurting you, please tell your healthcare provider.  Living Will/ Health care power of attorney- Discuss with your healthcare provider and family.  Impingement Syndrome, Rotator Cuff, Bursitis with Rehab Impingement syndrome is a condition that involves inflammation of the tendons of the rotator cuff and the subacromial bursa, that causes pain in the shoulder. The rotator cuff consists of four tendons and muscles that control much of the shoulder and upper arm function. The subacromial bursa is a fluid filled sac that helps reduce friction between the rotator cuff and one of the bones of the shoulder (acromion). Impingement syndrome is usually an overuse injury that causes swelling of the bursa (bursitis), swelling of the tendon (tendonitis), and/or a tear of the tendon (strain). Strains are classified into three categories. Grade 1 strains cause pain, but the tendon is not lengthened. Grade 2 strains include a lengthened ligament, due to the ligament being stretched or partially ruptured. With grade 2 strains there is still function, although the function may be decreased. Grade 3 strains include a complete tear of the tendon or muscle, and function is usually impaired. SYMPTOMS   Pain around the shoulder, often at the outer portion of the upper arm.  Pain that gets worse with shoulder function, especially when reaching overhead or lifting.  Sometimes, aching when not using the arm.  Pain that wakes you up at night.  Sometimes, tenderness, swelling, warmth, or redness over the affected area.  Loss of strength.  Limited motion of the shoulder, especially reaching behind the back (to the back pocket or to  unhook bra) or across your body.  Crackling sound (crepitation) when moving the arm.  Biceps tendon pain and inflammation (in the front of the shoulder). Worse when bending the  elbow or lifting. CAUSES  Impingement syndrome is often an overuse injury, in which chronic (repetitive) motions cause the tendons or bursa to become inflamed. A strain occurs when a force is paced on the tendon or muscle that is greater than it can withstand. Common mechanisms of injury include: Stress from sudden increase in duration, frequency, or intensity of training.  Direct hit (trauma) to the shoulder.  Aging, erosion of the tendon with normal use.  Bony bump on shoulder (acromial spur). RISK INCREASES WITH:  Contact sports (football, wrestling, boxing).  Throwing sports (baseball, tennis, volleyball).  Weightlifting and bodybuilding.  Heavy labor.  Previous injury to the rotator cuff, including impingement.  Poor shoulder strength and flexibility.  Failure to warm up properly before activity.  Inadequate protective equipment.  Old age.  Bony bump on shoulder (acromial spur). PREVENTION   Warm up and stretch properly before activity.  Allow for adequate recovery between workouts.  Maintain physical fitness:  Strength, flexibility, and endurance.  Cardiovascular fitness.  Learn and use proper exercise technique. PROGNOSIS  If treated properly, impingement syndrome usually goes away within 6 weeks. Sometimes surgery is required.  RELATED COMPLICATIONS   Longer healing time if not properly treated, or if not given enough time to heal.  Recurring symptoms, that result in a chronic condition.  Shoulder stiffness, frozen shoulder, or loss of motion.  Rotator cuff tendon tear.  Recurring symptoms, especially if activity is resumed too soon, with overuse, with a direct blow, or when using poor technique. TREATMENT  Treatment first involves the use of ice and medicine, to reduce pain and inflammation. The use of strengthening and stretching exercises may help reduce pain with activity. These exercises may be performed at home or with a therapist. If  non-surgical treatment is unsuccessful after more than 6 months, surgery may be advised. After surgery and rehabilitation, activity is usually possible in 3 months.  MEDICATION  If pain medicine is needed, nonsteroidal anti-inflammatory medicines (aspirin and ibuprofen), or other minor pain relievers (acetaminophen), are often advised.  Do not take pain medicine for 7 days before surgery.  Prescription pain relievers may be given, if your caregiver thinks they are needed. Use only as directed and only as much as you need.  Corticosteroid injections may be given by your caregiver. These injections should be reserved for the most serious cases, because they may only be given a certain number of times. HEAT AND COLD  Cold treatment (icing) should be applied for 10 to 15 minutes every 2 to 3 hours for inflammation and pain, and immediately after activity that aggravates your symptoms. Use ice packs or an ice massage.  Heat treatment may be used before performing stretching and strengthening activities prescribed by your caregiver, physical therapist, or athletic trainer. Use a heat pack or a warm water soak. SEEK MEDICAL CARE IF:   Symptoms get worse or do not improve in 4 to 6 weeks, despite treatment.  New, unexplained symptoms develop. (Drugs used in treatment may produce side effects.) EXERCISES  RANGE OF MOTION (ROM) AND STRETCHING EXERCISES - Impingement Syndrome (Rotator Cuff  Tendinitis, Bursitis) These exercises may help you when beginning to rehabilitate your injury. Your symptoms may go away with or without further involvement from your physician, physical therapist or athletic trainer. While completing these exercises, remember:  Restoring tissue flexibility helps normal motion to return to the joints. This allows healthier, less painful movement and activity.  An effective stretch should be held for at least 30 seconds.  A stretch should never be painful. You should only feel a  gentle lengthening or release in the stretched tissue. STRETCH - Flexion, Standing  Stand with good posture. With an underhand grip on your right / left hand, and an overhand grip on the opposite hand, grasp a broomstick or cane so that your hands are a little more than shoulder width apart.  Keeping your right / left elbow straight and shoulder muscles relaxed, push the stick with your opposite hand, to raise your right / left arm in front of your body and then overhead. Raise your arm until you feel a stretch in your right / left shoulder, but before you have increased shoulder pain.  Try to avoid shrugging your right / left shoulder as your arm rises, by keeping your shoulder blade tucked down and toward your mid-back spine. Hold for __________ seconds.  Slowly return to the starting position. Repeat __________ times. Complete this exercise __________ times per day. STRETCH - Abduction, Supine  Lie on your back. With an underhand grip on your right / left hand and an overhand grip on the opposite hand, grasp a broomstick or cane so that your hands are a little more than shoulder width apart.  Keeping your right / left elbow straight and your shoulder muscles relaxed, push the stick with your opposite hand, to raise your right / left arm out to the side of your body and then overhead. Raise your arm until you feel a stretch in your right / left shoulder, but before you have increased shoulder pain.  Try to avoid shrugging your right / left shoulder as your arm rises, by keeping your shoulder blade tucked down and toward your mid-back spine. Hold for __________ seconds.  Slowly return to the starting position. Repeat __________ times. Complete this exercise __________ times per day. ROM - Flexion, Active-Assisted  Lie on your back. You may bend your knees for comfort.  Grasp a broomstick or cane so your hands are about shoulder width apart. Your right / left hand should grip the end of the  stick, so that your hand is positioned "thumbs-up," as if you were about to shake hands.  Using your healthy arm to lead, raise your right / left arm overhead, until you feel a gentle stretch in your shoulder. Hold for __________ seconds.  Use the stick to assist in returning your right / left arm to its starting position. Repeat __________ times. Complete this exercise __________ times per day.  ROM - Internal Rotation, Supine   Lie on your back on a firm surface. Place your right / left elbow about 60 degrees away from your side. Elevate your elbow with a folded towel, so that the elbow and shoulder are the same height.  Using a broomstick or cane and your strong arm, pull your right / left hand toward your body until you feel a gentle stretch, but no increase in your shoulder pain. Keep your shoulder and elbow in place throughout the exercise.  Hold for __________ seconds. Slowly return to the starting position. Repeat __________ times. Complete this exercise __________ times per day. STRETCH - Internal Rotation  Place your right / left hand behind your back, palm up.  Throw a towel or belt over your opposite shoulder. Grasp the towel with your right /  left hand.  While keeping an upright posture, gently pull up on the towel, until you feel a stretch in the front of your right / left shoulder.  Avoid shrugging your right / left shoulder as your arm rises, by keeping your shoulder blade tucked down and toward your mid-back spine.  Hold for __________ seconds. Release the stretch, by lowering your healthy hand. Repeat __________ times. Complete this exercise __________ times per day. ROM - Internal Rotation   Using an underhand grip, grasp a stick behind your back with both hands.  While standing upright with good posture, slide the stick up your back until you feel a mild stretch in the front of your shoulder.  Hold for __________ seconds. Slowly return to your starting  position. Repeat __________ times. Complete this exercise __________ times per day.  STRETCH - Posterior Shoulder Capsule   Stand or sit with good posture. Grasp your right / left elbow and draw it across your chest, keeping it at the same height as your shoulder.  Pull your elbow, so your upper arm comes in closer to your chest. Pull until you feel a gentle stretch in the back of your shoulder.  Hold for __________ seconds. Repeat __________ times. Complete this exercise __________ times per day. STRENGTHENING EXERCISES - Impingement Syndrome (Rotator Cuff Tendinitis, Bursitis) These exercises may help you when beginning to rehabilitate your injury. They may resolve your symptoms with or without further involvement from your physician, physical therapist or athletic trainer. While completing these exercises, remember:  Muscles can gain both the endurance and the strength needed for everyday activities through controlled exercises.  Complete these exercises as instructed by your physician, physical therapist or athletic trainer. Increase the resistance and repetitions only as guided.  You may experience muscle soreness or fatigue, but the pain or discomfort you are trying to eliminate should never worsen during these exercises. If this pain does get worse, stop and make sure you are following the directions exactly. If the pain is still present after adjustments, discontinue the exercise until you can discuss the trouble with your clinician.  During your recovery, avoid activity or exercises which involve actions that place your injured hand or elbow above your head or behind your back or head. These positions stress the tissues which you are trying to heal. STRENGTH - Scapular Depression and Adduction   With good posture, sit on a firm chair. Support your arms in front of you, with pillows, arm rests, or on a table top. Have your elbows in line with the sides of your body.  Gently draw your  shoulder blades down and toward your mid-back spine. Gradually increase the tension, without tensing the muscles along the top of your shoulders and the back of your neck.  Hold for __________ seconds. Slowly release the tension and relax your muscles completely before starting the next repetition.  After you have practiced this exercise, remove the arm support and complete the exercise in standing as well as sitting position. Repeat __________ times. Complete this exercise __________ times per day.  STRENGTH - Shoulder Abductors, Isometric  With good posture, stand or sit about 4-6 inches from a wall, with your right / left side facing the wall.  Bend your right / left elbow. Gently press your right / left elbow into the wall. Increase the pressure gradually, until you are pressing as hard as you can, without shrugging your shoulder or increasing any shoulder discomfort.  Hold for __________ seconds.  Release the  tension slowly. Relax your shoulder muscles completely before you begin the next repetition. Repeat __________ times. Complete this exercise __________ times per day.  STRENGTH - External Rotators, Isometric  Keep your right / left elbow at your side and bend it 90 degrees.  Step into a door frame so that the outside of your right / left wrist can press against the door frame without your upper arm leaving your side.  Gently press your right / left wrist into the door frame, as if you were trying to swing the back of your hand away from your stomach. Gradually increase the tension, until you are pressing as hard as you can, without shrugging your shoulder or increasing any shoulder discomfort.  Hold for __________ seconds.  Release the tension slowly. Relax your shoulder muscles completely before you begin the next repetition. Repeat __________ times. Complete this exercise __________ times per day.  STRENGTH - Supraspinatus   Stand or sit with good posture. Grasp a __________  weight, or an exercise band or tubing, so that your hand is "thumbs-up," like you are shaking hands.  Slowly lift your right / left arm in a "V" away from your thigh, diagonally into the space between your side and straight ahead. Lift your hand to shoulder height or as far as you can, without increasing any shoulder pain. At first, many people do not lift their hands above shoulder height.  Avoid shrugging your right / left shoulder as your arm rises, by keeping your shoulder blade tucked down and toward your mid-back spine.  Hold for __________ seconds. Control the descent of your hand, as you slowly return to your starting position. Repeat __________ times. Complete this exercise __________ times per day.  STRENGTH - External Rotators  Secure a rubber exercise band or tubing to a fixed object (table, pole) so that it is at the same height as your right / left elbow when you are standing or sitting on a firm surface.  Stand or sit so that the secured exercise band is at your uninjured side.  Bend your right / left elbow 90 degrees. Place a folded towel or small pillow under your right / left arm, so that your elbow is a few inches away from your side.  Keeping the tension on the exercise band, pull it away from your body, as if pivoting on your elbow. Be sure to keep your body steady, so that the movement is coming only from your rotating shoulder.  Hold for __________ seconds. Release the tension in a controlled manner, as you return to the starting position. Repeat __________ times. Complete this exercise __________ times per day.  STRENGTH - Internal Rotators   Secure a rubber exercise band or tubing to a fixed object (table, pole) so that it is at the same height as your right / left elbow when you are standing or sitting on a firm surface.  Stand or sit so that the secured exercise band is at your right / left side.  Bend your elbow 90 degrees. Place a folded towel or small pillow  under your right / left arm so that your elbow is a few inches away from your side.  Keeping the tension on the exercise band, pull it across your body, toward your stomach. Be sure to keep your body steady, so that the movement is coming only from your rotating shoulder.  Hold for __________ seconds. Release the tension in a controlled manner, as you return to the starting position.  Repeat __________ times. Complete this exercise __________ times per day.  STRENGTH - Scapular Protractors, Standing   Stand arms length away from a wall. Place your hands on the wall, keeping your elbows straight.  Begin by dropping your shoulder blades down and toward your mid-back spine.  To strengthen your protractors, keep your shoulder blades down, but slide them forward on your rib cage. It will feel as if you are lifting the back of your rib cage away from the wall. This is a subtle motion and can be challenging to complete. Ask your caregiver for further instruction, if you are not sure you are doing the exercise correctly.  Hold for __________ seconds. Slowly return to the starting position, resting the muscles completely before starting the next repetition. Repeat __________ times. Complete this exercise __________ times per day. STRENGTH - Scapular Protractors, Supine  Lie on your back on a firm surface. Extend your right / left arm straight into the air while holding a __________ weight in your hand.  Keeping your head and back in place, lift your shoulder off the floor.  Hold for __________ seconds. Slowly return to the starting position, and allow your muscles to relax completely before starting the next repetition. Repeat __________ times. Complete this exercise __________ times per day. STRENGTH - Scapular Protractors, Quadruped  Get onto your hands and knees, with your shoulders directly over your hands (or as close as you can be, comfortably).  Keeping your elbows locked, lift the back of  your rib cage up into your shoulder blades, so your mid-back rounds out. Keep your neck muscles relaxed.  Hold this position for __________ seconds. Slowly return to the starting position and allow your muscles to relax completely before starting the next repetition. Repeat __________ times. Complete this exercise __________ times per day.  STRENGTH - Scapular Retractors  Secure a rubber exercise band or tubing to a fixed object (table, pole), so that it is at the height of your shoulders when you are either standing, or sitting on a firm armless chair.  With a palm down grip, grasp an end of the band in each hand. Straighten your elbows and lift your hands straight in front of you, at shoulder height. Step back, away from the secured end of the band, until it becomes tense.  Squeezing your shoulder blades together, draw your elbows back toward your sides, as you bend them. Keep your upper arms lifted away from your body throughout the exercise.  Hold for __________ seconds. Slowly ease the tension on the band, as you reverse the directions and return to the starting position. Repeat __________ times. Complete this exercise __________ times per day. STRENGTH - Shoulder Extensors   Secure a rubber exercise band or tubing to a fixed object (table, pole) so that it is at the height of your shoulders when you are either standing, or sitting on a firm armless chair.  With a thumbs-up grip, grasp an end of the band in each hand. Straighten your elbows and lift your hands straight in front of you, at shoulder height. Step back, away from the secured end of the band, until it becomes tense.  Squeezing your shoulder blades together, pull your hands down to the sides of your thighs. Do not allow your hands to go behind you.  Hold for __________ seconds. Slowly ease the tension on the band, as you reverse the directions and return to the starting position. Repeat __________ times. Complete this exercise  __________ times per  day.  STRENGTH - Scapular Retractors and External Rotators   Secure a rubber exercise band or tubing to a fixed object (table, pole) so that it is at the height as your shoulders, when you are either standing, or sitting on a firm armless chair.  With a palm down grip, grasp an end of the band in each hand. Bend your elbows 90 degrees and lift your elbows to shoulder height, at your sides. Step back, away from the secured end of the band, until it becomes tense.  Squeezing your shoulder blades together, rotate your shoulders so that your upper arms and elbows remain stationary, but your fists travel upward to head height.  Hold for __________ seconds. Slowly ease the tension on the band, as you reverse the directions and return to the starting position. Repeat __________ times. Complete this exercise __________ times per day.  STRENGTH - Scapular Retractors and External Rotators, Rowing   Secure a rubber exercise band or tubing to a fixed object (table, pole) so that it is at the height of your shoulders, when you are either standing, or sitting on a firm armless chair.  With a palm down grip, grasp an end of the band in each hand. Straighten your elbows and lift your hands straight in front of you, at shoulder height. Step back, away from the secured end of the band, until it becomes tense.  Step 1: Squeeze your shoulder blades together. Bending your elbows, draw your hands to your chest, as if you are rowing a boat. At the end of this motion, your hands and elbow should be at shoulder height and your elbows should be out to your sides.  Step 2: Rotate your shoulders, to raise your hands above your head. Your forearms should be vertical and your upper arms should be horizontal.  Hold for __________ seconds. Slowly ease the tension on the band, as you reverse the directions and return to the starting position. Repeat __________ times. Complete this exercise __________ times  per day.  STRENGTH - Scapular Depressors  Find a sturdy chair without wheels, such as a dining room chair.  Keeping your feet on the floor, and your hands on the chair arms, lift your bottom up from the seat, and lock your elbows.  Keeping your elbows straight, allow gravity to pull your body weight down. Your shoulders will rise toward your ears.  Raise your body against gravity by drawing your shoulder blades down your back, shortening the distance between your shoulders and ears. Although your feet should always maintain contact with the floor, your feet should progressively support less body weight, as you get stronger.  Hold for __________ seconds. In a controlled and slow manner, lower your body weight to begin the next repetition. Repeat __________ times. Complete this exercise __________ times per day.  Document Released: 08/30/2005 Document Revised: 11/22/2011 Document Reviewed: 12/12/2008 [facility] Patient Information 2015 [place], [place]. This information is not intended to replace advice given to you by your health care provider. Make sure you discuss any questions you have with your health care provider.    I personally performed the services described in this documentation, which was scribed in my presence. The recorded information has been reviewed and considered, and addended by me as needed.

## 2014-05-19 LAB — CBC WITH DIFFERENTIAL/PLATELET
Basophils Absolute: 0.1 10*3/uL (ref 0.0–0.1)
Basophils Relative: 1 % (ref 0–1)
EOS PCT: 2 % (ref 0–5)
Eosinophils Absolute: 0.1 10*3/uL (ref 0.0–0.7)
HEMATOCRIT: 39 % (ref 36.0–46.0)
HEMOGLOBIN: 13.2 g/dL (ref 12.0–15.0)
LYMPHS ABS: 1.9 10*3/uL (ref 0.7–4.0)
Lymphocytes Relative: 29 % (ref 12–46)
MCH: 27.5 pg (ref 26.0–34.0)
MCHC: 33.8 g/dL (ref 30.0–36.0)
MCV: 81.3 fL (ref 78.0–100.0)
MONOS PCT: 7 % (ref 3–12)
Monocytes Absolute: 0.5 10*3/uL (ref 0.1–1.0)
Neutro Abs: 4.1 10*3/uL (ref 1.7–7.7)
Neutrophils Relative %: 61 % (ref 43–77)
Platelets: 339 10*3/uL (ref 150–400)
RBC: 4.8 MIL/uL (ref 3.87–5.11)
RDW: 15.6 % — ABNORMAL HIGH (ref 11.5–15.5)
WBC: 6.7 10*3/uL (ref 4.0–10.5)

## 2014-05-19 LAB — COMPREHENSIVE METABOLIC PANEL
ALT: 12 U/L (ref 0–35)
AST: 17 U/L (ref 0–37)
Albumin: 4.6 g/dL (ref 3.5–5.2)
Alkaline Phosphatase: 84 U/L (ref 39–117)
BUN: 13 mg/dL (ref 6–23)
CALCIUM: 9.8 mg/dL (ref 8.4–10.5)
CHLORIDE: 104 meq/L (ref 96–112)
CO2: 26 mEq/L (ref 19–32)
Creat: 0.82 mg/dL (ref 0.50–1.10)
Glucose, Bld: 97 mg/dL (ref 70–99)
Potassium: 4.5 mEq/L (ref 3.5–5.3)
SODIUM: 139 meq/L (ref 135–145)
TOTAL PROTEIN: 7.4 g/dL (ref 6.0–8.3)
Total Bilirubin: 0.5 mg/dL (ref 0.2–1.2)

## 2014-05-19 LAB — LIPID PANEL
Cholesterol: 209 mg/dL — ABNORMAL HIGH (ref 0–200)
HDL: 54 mg/dL (ref >39)
LDL Cholesterol: 127 mg/dL — ABNORMAL HIGH (ref 0–99)
Total CHOL/HDL Ratio: 3.9 Ratio
Triglycerides: 138 mg/dL (ref <150)
VLDL: 28 mg/dL (ref 0–40)

## 2014-05-29 ENCOUNTER — Ambulatory Visit (INDEPENDENT_AMBULATORY_CARE_PROVIDER_SITE_OTHER): Payer: Managed Care, Other (non HMO) | Admitting: Neurology

## 2014-05-29 ENCOUNTER — Encounter: Payer: Self-pay | Admitting: Neurology

## 2014-05-29 VITALS — BP 169/88 | HR 95 | Temp 97.0°F | Ht 65.0 in | Wt 263.0 lb

## 2014-05-29 DIAGNOSIS — R519 Headache, unspecified: Secondary | ICD-10-CM

## 2014-05-29 DIAGNOSIS — R51 Headache: Secondary | ICD-10-CM

## 2014-05-29 DIAGNOSIS — G4733 Obstructive sleep apnea (adult) (pediatric): Secondary | ICD-10-CM

## 2014-05-29 NOTE — Patient Instructions (Signed)

## 2014-05-29 NOTE — Progress Notes (Signed)
Subjective:    Patient ID: Felicia Zamora is a 58 y.o. female.  HPI    Felicia Age, MD, PhD [facility] [address], [address] [place], [place] [address]  Dear Dr. Carlota Zamora,   I saw your patient, Felicia Zamora, upon your kind request in my neurologic clinic today for initial consultation of her sleep disorder, in particular, concern for underlying obstructive sleep apnea. The patient is accompanied by her husband, Felicia Zamora), today. As you know, Ms. Wisdom is a 58 year old right-handed woman with an underlying medical history of obesity, status post gastric bypass surgery in 2006, degenerative spine disease, status post C-spine surgery in 2006, anemia, diverticulosis, arthritis, history of bowel obstruction, who has had significant weight gain (almost back to pre-surgery) after being able to lose a significant amount of weight after her gastric bypass surgery with her weight nadir of 190 pounds. She reports snoring, and complains of daytime somnolence, and non-restorative sleep. She has noted recent increases in her blood pressure values.   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 She is a non-smoker and drinks alcohol very rarely. She drinks 3 cups of coffee in the morning and some tea during the day. She does not watch TV while in bed. She has no pets in her bedroom or bed. She may nap on the weekend and feels better. She has occasional nocturnal leg cramps. She has rare morning headaches.   Her typical bedtime is reported to be around 11:30 PM and usual wake time is around 5 to 6 AM. Sleep onset typically occurs within 1 hour. She reports feeling margianally rested upon awakening. She wakes up on an average 1 times in the middle of the night and has to go to the bathroom 0 to 1 times on a typical night.  She rarely sleep talks, but has no sleep walking, no sleep eating.  She reports excessive daytime somnolence (EDS) and Her Epworth Sleepiness Score (ESS) is 6/24  today. She has not fallen asleep while driving. The patient has not been taking a planned nap.  She has been known to snore for the past many years. Snoring is reportedly severe, and associated with choking sounds and witnessed apneas. The patient admits to a sense of choking or strangling feeling. There is no report of nighttime reflux, with rare nighttime cough experienced. The patient has not noted any RLS symptoms, but is a restless sleeper and tosses and turns, per husband, no kicking in sleep. There is no family history of RLS or OSA.  He is a restless sleeper and in the morning, the bed is quite disheveled.   She denies cataplexy, sleep paralysis (except one time years Zamora), hypnagogic or hypnopompic hallucinations, or sleep attacks. She does report vivid dreams, no nightmares, dream enactments, or other parasomnias. The patient has not had a sleep study or a home sleep test.   Her Past Medical History Is Significant For: Past Medical History  Diagnosis Date  . Anemia   . Colon polyp   . Diverticulosis   . Arthritis   . Obesity   . Bowel obstruction   . Diverticulitis of colon (without mention of hemorrhage) 11/03/2012    Her Past Surgical History Is Significant For: Past Surgical History  Procedure Laterality Date  . Gastric bypass  2006  . Abdominal hysterectomy  07/10/2007    Diagnosis: menorrhagia  . Spine surgery  2006    C-spine herniated disc  . Cesarean section  1990  .  Colonoscopy w/ polypectomy      multiple  . Tubal ligation      Her Family History Is Significant For: Family History  Problem Relation Zamora of Onset  . Diabetes Mother   . Stomach cancer Mother   . Diabetes Father   . Heart disease Father   . Hypertension Father   . Cirrhosis Brother   . Colon cancer Sister 62  . Colon polyps Sister   . Diabetes Sister   . Diabetes Brother   . Thyroid disease Daughter   . Lung cancer Maternal Grandfather   . Alzheimer's disease Paternal Grandmother   .  Esophageal cancer Neg Hx   . Rectal cancer Neg Hx     Her Social History Is Significant For: History   Social History  . Marital Status: Divorced    Spouse Name: Herbie Zamora    Number of Children: 4  . Years of Education: some coll.   Occupational History  . 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 Smoking status: Never Smoker   . Smokeless tobacco: Never Used  . Alcohol Use: Yes     Comment: occas.  . Drug Use: No  . Sexual Activity: None   Other Topics Concern  . None   Social History Narrative   Patient is left handed and resides with husband,consumes 3 cups of caffeine daily    Her Allergies Are:  Allergies  Allergen Reactions  . Codeine     Pain increases instead of decreasing  . Hydrochlorothiazide     Cramps in thighs  . Oxycodone-Acetaminophen   . Paxil [Paroxetine Hcl]     Patient states Paxil keeps her awake  . Sulfonamide Derivatives     Per pt: unknown  :   Her Current Medications Are:  Outpatient Encounter Prescriptions as of 05/29/2014  Medication Sig  . B Complex Vitamins (VITAMIN-B COMPLEX PO) Take 1 capsule by mouth daily.  Marland Kitchen CALCIUM PO Take 1 capsule by mouth 2 (two) times daily.  . ferrous sulfate (FERROUSUL) 325 (65 FE) MG tablet Take 1 tablet (325 mg total) by mouth daily with breakfast.  . MAGNESIUM PO Take by mouth daily.  . Multiple Vitamin (MULTIVITAMIN) tablet Take 1 tablet by mouth daily.  . Omega-3 Fatty Acids (FISH OIL PO) Take 1 capsule by mouth daily.  Marland Kitchen POTASSIUM PO Take 1 capsule by mouth daily.  Marland Kitchen VITAMIN D, CHOLECALCIFEROL, PO Take by mouth daily.  :  Review of Systems:  Out of a complete 14 point review of systems, all are reviewed and negative with the exception of these symptoms as listed below:  Review of Systems  Constitutional:       Weight gain  Respiratory:       Snoring  Cardiovascular:       Swelling in ankles  Endocrine: Positive for cold intolerance.  Musculoskeletal:       Cramps,  aching muscles  Skin:       itching  Allergic/Immunologic: Positive for environmental allergies.  Hematological:       Anemia,maybe  Psychiatric/Behavioral:       Not enough sleep, decreased energy    Objective:  Neurologic Exam  Physical Exam Physical Examination:   Filed Vitals:   05/29/14 0849  BP: 169/88  Pulse: 95  Temp: 97 F (36.1 C)    General Examination: The patient is a very pleasant 58 y.o. female in no acute distress. She appears well-developed and well-nourished  and well groomed.   HEENT: Normocephalic, atraumatic, pupils are equal, round and reactive to light and accommodation. Funduscopic exam is normal with sharp disc margins noted. Extraocular tracking is good without limitation to gaze excursion or nystagmus noted. Normal smooth pursuit is noted. Hearing is grossly intact. Tympanic membranes are clear bilaterally. Face is symmetric with normal facial animation and normal facial sensation. Speech is clear with no dysarthria noted. There is no hypophonia. There is no lip, neck/head, jaw or voice tremor. Neck is supple with full range of passive and active motion. There are no carotid bruits on auscultation. Oropharynx exam reveals: moderate mouth dryness, adequate dental hygiene and moderate airway crowding, due to redundant soft palate and narrow airway entry. Mallampati is class III. Tongue protrudes centrally and palate elevates symmetrically. She has mild pharyngeal erythema. She reports some recent postnasal drip. Tonsils are 1+ in size/absent. Neck size is 14.25 inches. She has a tiny overbite. Nasal inspection reveals no significant nasal mucosal bogginess or redness and no septal deviation.   Chest: Clear to auscultation without wheezing, rhonchi or crackles noted.  Heart: S1+S2+0, regular and normal without murmurs, rubs or gallops noted.   Abdomen: Soft, non-tender and non-distended with normal bowel sounds appreciated on auscultation.  Extremities: There  is trace pitting edema in the distal lower extremities bilaterally. Pedal pulses are intact.  Skin: Warm and dry without trophic changes noted. There are no varicose veins.  Musculoskeletal: exam reveals no obvious joint deformities, tenderness or joint swelling or erythema.   Neurologically:  Mental status: The patient is awake, alert and oriented in all 4 spheres. Her immediate and remote memory, attention, language skills and fund of knowledge are appropriate. There is no evidence of aphasia, agnosia, apraxia or anomia. Speech is clear with normal prosody and enunciation. Thought process is linear. Mood is normal and affect is normal.  Cranial nerves II - XII are as described above under HEENT exam. In addition: shoulder shrug is normal with equal shoulder height noted. Motor exam: Normal bulk, strength and tone is noted. There is no drift, tremor or rebound. Romberg is negative. Reflexes are 2+ throughout. Babinski: Toes are flexor bilaterally. Fine motor skills and coordination: intact with normal finger taps, normal hand movements, normal rapid alternating patting, normal foot taps and normal foot agility.  Cerebellar testing: No dysmetria or intention tremor on finger to nose testing. Heel to shin is unremarkable bilaterally. There is no truncal or gait ataxia.  Sensory exam: intact to light touch, pinprick, vibration, temperature sense in the upper and lower extremities.  Gait, station and balance: She stands with mild difficulty. No veering to one side is noted. No leaning to one side is noted. Posture is Zamora-appropriate and stance is narrow based. Gait shows normal stride length and normal pace. No problems turning are noted. She turns en bloc, however, tandem walk is difficult for her and so are toe and heel stance, primarily because of body habitus.              Assessment and Plan:   In summary, REECE MCBROOM is a very pleasant 58 y.o.-year old female with an underlying medical history  of obesity, status post gastric bypass surgery in 2006, degenerative spine disease, status post C-spine surgery in 2006, anemia, diverticulosis, arthritis, history of bowel obstruction, who has had significant weight gain (almost back to pre-surgery) after being able to lose a significant amount of weight after her gastric bypass surgery with her weight nadir of 190 pounds.  She reports snoring, and witnessed apneas, and complains of daytime somnolence, some morning headaches, non-restorative sleep and recent increases in her blood pressure values. Her history and physical exam are concerning for obstructive sleep apnea (OSA). I had a long chat with the patient and  her husband  about my findings and the diagnosis of OSA, its prognosis and treatment options. We talked about medical treatments, surgical interventions and non-pharmacological approaches. I explained in particular the risks and ramifications of untreated moderate to severe OSA, especially with respect to developing cardiovascular disease down the Road, including congestive heart failure, difficult to treat hypertension, cardiac arrhythmias, or stroke. Even type 2 diabetes has, in part, been linked to untreated OSA. Symptoms of untreated OSA include daytime sleepiness, memory problems, mood irritability and mood disorder such as depression and anxiety, lack of energy, as well as recurrent headaches, especially morning headaches. We talked about trying to maintain a healthy lifestyle in general, as well as the importance of weight control. I encouraged the patient to eat healthy, exercise daily and keep well hydrated, to keep a scheduled bedtime and wake time routine, to not skip any meals and eat healthy snacks in between meals. I advised the patient not to drive when feeling sleepy. I recommended the following at this time: sleep study with potential positive airway pressure titration. (We will score hypopneas at 3% and split the sleep study into  diagnostic and treatment portion, if the estimated. 2 hour AHI is >20/h).   I explained the sleep test procedure to the patient and also outlined possible surgical and non-surgical treatment options of OSA, including the use of a custom-made dental device (which would require a referral to a specialist dentist or oral surgeon), upper airway surgical options, such as pillar implants, radiofrequency surgery, tongue base surgery, and UPPP (which would involve a referral to an ENT surgeon). Rarely, jaw surgery such as mandibular advancement may be considered.  I also explained the CPAP treatment option to the patient, who indicated that she would be willing to try CPAP if the need arises. I explained the importance of being compliant with PAP treatment, not only for insurance purposes but primarily to improve Her symptoms, and for the patient's long term health benefit, including to reduce Her cardiovascular risks. I answered all their questions today and the patient and Felicia Santee were in agreement. I would like to see her back after the sleep study is completed and encouraged her to call with any interim questions, concerns, problems or updates.   Thank you very much for allowing me to participate in the care of this nice patient. If I can be of any further assistance to you please do not hesitate to call me at [phone].  Sincerely,   Felicia Age, MD, PhD

## 2014-06-09 ENCOUNTER — Encounter: Payer: Self-pay | Admitting: Radiology

## 2014-06-11 ENCOUNTER — Encounter: Payer: Self-pay | Admitting: Neurology

## 2014-06-26 ENCOUNTER — Ambulatory Visit (INDEPENDENT_AMBULATORY_CARE_PROVIDER_SITE_OTHER): Payer: Managed Care, Other (non HMO)

## 2014-06-26 DIAGNOSIS — R51 Headache: Secondary | ICD-10-CM

## 2014-06-26 DIAGNOSIS — G4733 Obstructive sleep apnea (adult) (pediatric): Secondary | ICD-10-CM

## 2014-06-26 DIAGNOSIS — R519 Headache, unspecified: Secondary | ICD-10-CM

## 2014-07-05 ENCOUNTER — Telehealth: Payer: Self-pay | Admitting: Neurology

## 2014-07-05 NOTE — Telephone Encounter (Signed)
Please call and notify the patient that the recent sleep study did show overall mild obstructive sleep apnea. Please inform patient that I would like to go over the details of the study and treatment options during a follow up appointment and if not already previously scheduled, arrange a followup appointment (please utilize a followu-up slot). Also, route or fax report to PCP and referring MD, if other than PCP.  Once you have spoken to patient, you can close this encounter.   Thanks,  Star Age, MD, PhD [facility])

## 2014-07-08 NOTE — Telephone Encounter (Signed)
Patient was called and provided the results of her overnight sleep study.  Patient was informed that there was mild sleep apnea present and a follow up appointment was requested by Dr. Rexene Alberts to discuss possible treatment options.  A copy of the report was mailed to the patient and a copy was sent to Dr. Merri Ray.

## 2014-07-10 ENCOUNTER — Ambulatory Visit (INDEPENDENT_AMBULATORY_CARE_PROVIDER_SITE_OTHER): Payer: Managed Care, Other (non HMO) | Admitting: Neurology

## 2014-07-10 ENCOUNTER — Encounter: Payer: Self-pay | Admitting: Neurology

## 2014-07-10 VITALS — BP 152/87 | HR 62 | Ht 65.0 in | Wt 262.0 lb

## 2014-07-10 DIAGNOSIS — G479 Sleep disorder, unspecified: Secondary | ICD-10-CM

## 2014-07-10 DIAGNOSIS — R51 Headache: Secondary | ICD-10-CM

## 2014-07-10 DIAGNOSIS — G4733 Obstructive sleep apnea (adult) (pediatric): Secondary | ICD-10-CM

## 2014-07-10 DIAGNOSIS — R351 Nocturia: Secondary | ICD-10-CM

## 2014-07-10 DIAGNOSIS — R519 Headache, unspecified: Secondary | ICD-10-CM

## 2014-07-10 NOTE — Progress Notes (Signed)
Subjective:    Zamora ID: Felicia Zamora is a 58 y.o. female.  HPI    Interim history:   Felicia Zamora is a 58 year old right-handed woman with an underlying medical history of obesity, status post gastric bypass surgery in 2006, degenerative spine disease, status post C-spine surgery in 2006, anemia, diverticulosis, arthritis, history of bowel obstruction, who presents for follow-up consultation of Felicia Zamora sleep apnea. Felicia Zamora is unaccompanied today. Felicia Zamora first met Felicia Zamora on 05/29/2014 at Felicia request of Felicia Zamora primary care physician, at which time Felicia Zamora reported significant weight gain, almost back to presurgery level, snoring, and daytime somnolence as well as nonrestorative sleep. Felicia Zamora suggested Felicia Zamora return for a sleep study. Felicia Zamora had a baseline sleep study on 05/29/2014 and went over Felicia Zamora test results with Felicia Zamora in detail today. Sleep efficiency was markedly reduced at 52.8% with a prolonged latency to sleep of 41.5 minutes and wake after sleep onset of 186.5 minutes with mild sleep fragmentation noted and a prolonged period of wakefulness between 1:05 AM and 3:30 AM. Felicia Zamora had a normal arousal index. Felicia Zamora had an increased percentage of stage II sleep, a mildly decreased percentage of slow wave sleep, and a decreased percentage of REM sleep with a REM latency of 69.5 minutes. Felicia Zamora did not have any significant EKG changes. Felicia Zamora did not have any significant PLMS. Felicia Zamora had mild to moderate and at times louder snoring. Felicia Zamora slept mostly on Felicia Zamora sides. Felicia Zamora total AHI was 8.2 per hour, rising to 25.4 per hour in REM sleep. Baseline oxygen saturation was 93%, nadir was 85%. Time below 90% saturation was 7 minutes and 6 seconds. Of note, no supine REM sleep was achieved in this study. Felicia Zamora asked Felicia Zamora return for a follow-up appointment to discuss potential treatment options.  Today, Felicia Zamora reports more restless sleep lately and Felicia Zamora usually wakes between 5 and 6 AM and has to go to Felicia bathroom and may not go back to sleep. Felicia Zamora has had LBP, not severe. Felicia Zamora  has been in touch with bariatric surgery for evaluation for potential revision of Felicia Zamora gastric bypass surgery as Felicia Zamora has had regained almost all Felicia Zamora weight from before surgery. Felicia Zamora has occasional morning headaches.   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 Felicia Zamora is a non-smoker and drinks alcohol very rarely. Felicia Zamora drinks 3 cups of coffee in Felicia morning and some tea during Felicia day. Felicia Zamora does not watch TV while in bed. Felicia Zamora has no pets in Felicia Zamora bedroom or bed. Felicia Zamora may nap on Felicia weekend and feels better. Felicia Zamora has occasional nocturnal leg cramps. Felicia Zamora has rare morning headaches.    Felicia Zamora Past Medical History Is Significant For: Past Medical History  Diagnosis Date  . Anemia   . Colon polyp   . Diverticulosis   . Arthritis   . Obesity   . Bowel obstruction   . Diverticulitis of colon (without mention of hemorrhage) 11/03/2012    Felicia Zamora Past Surgical History Is Significant For: Past Surgical History  Procedure Laterality Date  . Gastric bypass  2006  . Abdominal hysterectomy  07/10/2007    Diagnosis: menorrhagia  . Spine surgery  2006    C-spine herniated disc  . Cesarean section  1990  . Colonoscopy w/ polypectomy      multiple  . Tubal ligation      Felicia Zamora Family History Is Significant For: Family History  Problem Relation Age of Onset  . Diabetes Mother   . Stomach cancer Mother   . Diabetes Father   .  Heart disease Father   . Hypertension Father   . Cirrhosis Brother   . Colon cancer Zamora 80  . Colon polyps Zamora   . Diabetes Zamora   . Diabetes Brother   . Thyroid disease Daughter   . Lung cancer Maternal Grandfather   . Alzheimer's disease Paternal Grandmother   . Esophageal cancer Neg Hx   . Rectal cancer Neg Hx     Felicia Zamora Social History Is Significant For: History   Social History  . Marital Status: Divorced    Spouse Name: Herbie Baltimore    Number of Children: 4  . Years of Education: some coll.   Occupational History  . Data Entry     work from home   Social History Main  Topics  . Smoking status: Never Smoker   . Smokeless tobacco: Never Used  . Alcohol Use: Yes     Comment: occas.  . Drug Use: No  . Sexual Activity: None   Other Topics Concern  . None   Social History Narrative   Zamora is left handed and resides with husband,consumes 3 cups of caffeine daily    Felicia Zamora Allergies Are:  Allergies  Allergen Reactions  . Codeine     Pain increases instead of decreasing  . Hydrochlorothiazide     Cramps in thighs  . Oxycodone-Acetaminophen   . Paxil [Paroxetine Hcl]     Zamora states Paxil keeps Felicia Zamora awake  . Sulfonamide Derivatives     Per pt: unknown  :   Felicia Zamora Current Medications Are:  Outpatient Encounter Prescriptions as of 07/10/2014  Medication Sig  . CALCIUM PO Take 1 capsule by mouth 2 (two) times daily.  . ferrous sulfate (FERROUSUL) 325 (65 FE) MG tablet Take 1 tablet (325 mg total) by mouth daily with breakfast.  . MAGNESIUM PO Take by mouth daily.  . Multiple Vitamin (MULTIVITAMIN) tablet Take 1 tablet by mouth daily.  . Omega-3 Fatty Acids (FISH OIL PO) Take 1 capsule by mouth daily.  Marland Kitchen VITAMIN D, CHOLECALCIFEROL, PO Take by mouth daily.  . [DISCONTINUED] B Complex Vitamins (VITAMIN-B COMPLEX PO) Take 1 capsule by mouth daily.  . [DISCONTINUED] POTASSIUM PO Take 1 capsule by mouth daily.  :  Review of Systems:  Out of a complete 14 point review of systems, all are reviewed and negative with Felicia exception of these symptoms as listed below:   Review of Systems  Psychiatric/Behavioral: Positive for sleep disturbance.   LBP, problems staying asleep.   Objective:  Neurologic Exam  Physical Exam Physical Examination:   Filed Vitals:   07/10/14 0901  BP: 152/87  Pulse: 62    General Examination: Felicia Zamora is a very pleasant 58 y.o. female in no acute distress. Felicia Zamora appears well-developed and well-nourished and well groomed.   HEENT: Normocephalic, atraumatic, pupils are equal, round and reactive to light and  accommodation. Funduscopic exam is normal with sharp disc margins noted. Extraocular tracking is good without limitation to gaze excursion or nystagmus noted. Normal smooth pursuit is noted. Hearing is grossly intact. Face is symmetric with normal facial animation and normal facial sensation. Speech is clear with no dysarthria noted. There is no hypophonia. There is no lip, neck/head, jaw or voice tremor. Neck is supple with full range of passive and active motion. There are no carotid bruits on auscultation. Oropharynx exam reveals: moderate mouth dryness, adequate dental hygiene and moderate airway crowding, due to redundant soft palate and narrow airway entry. Mallampati is class III. Tongue protrudes centrally  and palate elevates symmetrically. Felicia Zamora has mild pharyngeal erythema. Felicia Zamora reports some recent postnasal drip. Tonsils are 1+ in size. Neck size is 14.25 inches. Felicia Zamora has a tiny overbite. Nasal inspection reveals no significant nasal mucosal bogginess or redness and no septal deviation.   Chest: Clear to auscultation without wheezing, rhonchi or crackles noted.  Heart: S1+S2+0, regular and normal without murmurs, rubs or gallops noted.   Abdomen: Soft, non-tender and non-distended with normal bowel sounds appreciated on auscultation.  Extremities: There is trace pitting edema in Felicia distal lower extremities bilaterally. Pedal pulses are intact.  Skin: Warm and dry without trophic changes noted. There are no varicose veins.  Musculoskeletal: exam reveals no obvious joint deformities, tenderness or joint swelling or erythema.   Neurologically:  Mental status: Felicia Zamora is awake, alert and oriented in all 4 spheres. Felicia Zamora immediate and remote memory, attention, language skills and fund of knowledge are appropriate. There is no evidence of aphasia, agnosia, apraxia or anomia. Speech is clear with normal prosody and enunciation. Thought process is linear. Mood is normal and affect is normal.   Cranial nerves II - XII are as described above under HEENT exam. In addition: shoulder shrug is normal with equal shoulder height noted. Motor exam: Normal bulk, strength and tone is noted. There is no drift, tremor or rebound. Romberg is negative. Reflexes are 2+ throughout. Babinski: Toes are flexor bilaterally. Fine motor skills and coordination: intact with normal finger taps, normal hand movements, normal rapid alternating patting, normal foot taps and normal foot agility.  Cerebellar testing: No dysmetria or intention tremor on finger to nose testing. Heel to shin is slightly difficult for Felicia Zamora due to low back pain and body habitus. There is no truncal or gait ataxia.  Sensory exam: intact to light touch, pinprick, vibration, temperature sense in Felicia upper and lower extremities.  Gait, station and balance: Felicia Zamora stands with mild difficulty. No veering to one side is noted. No leaning to one side is noted. Posture is age-appropriate and stance is narrow based. Gait shows normal stride length and normal pace. No problems turning are noted. Felicia Zamora turns en bloc, however, tandem walk is difficult for Felicia Zamora and so are toe and heel stance, primarily because of body habitus, unchanged.              Assessment and Plan:   In summary, TASHEIKA KITZMILLER is a very pleasant 58 year old female with an underlying medical history of obesity, status post gastric bypass surgery in 2006 with weight loss, then regaining of weight, degenerative spine disease, status post C-spine surgery in 2006, anemia, diverticulosis, arthritis, history of bowel obstruction, who presents for follow up consultation of Felicia Zamora sleep issues, including snoring, restless sleep, EDS and nocturia. Felicia Zamora recent sleep study showed overall mild sleep apnea and we discussed Felicia results in detail today. Since Felicia Zamora has symptoms regarding Felicia Zamora sleep, treatment of Felicia Zamora OSA with PAP is indicated and justified. Therefore, Felicia Zamora will order AutoPap for Felicia Zamora to use at home for Felicia  next several weeks and Felicia Zamora will see Felicia Zamora back with a compliance download and also see if Felicia Zamora feels better. Felicia Zamora is in Felicia process of being evaluated potentially for a revision of Felicia Zamora gastric bypass surgery as Felicia Zamora had weight gain to Felicia point of being at Felicia Zamora presurgery level at this time. Felicia Zamora has had similar issues. Felicia Zamora physical exam is stable. At this juncture Felicia Zamora will ask Felicia Zamora to come back in about 3 months and we will  set Felicia Zamora up with AutoPap therapy at home through a local DME company. Felicia Zamora answered all Felicia Zamora questions today and we again talked about sleep apnea, its prognosis and treatment options. Felicia Zamora also explained Felicia PAP treatment option to Felicia Zamora, who indicated that Felicia Zamora would be willing to try CPAP if Felicia need arises. Felicia Zamora explained Felicia importance of being compliant with PAP treatment, not only for insurance purposes but primarily to improve Felicia Zamora symptoms, and for Felicia Zamora's long term health benefit, including to reduce Felicia Zamora cardiovascular risks. Felicia Zamora answered all their questions today and Felicia Zamora was in agreement.

## 2014-07-10 NOTE — Patient Instructions (Signed)
We will try treatment of your OSA with an autoPAP machine and see if you feel better. I will see you back in 3 months.

## 2014-07-19 ENCOUNTER — Telehealth: Payer: Self-pay | Admitting: *Deleted

## 2014-07-19 NOTE — Telephone Encounter (Signed)
Patient has notified [place] that she wants to decline being set up on CPAP due to the out of pocket expense.

## 2014-08-07 IMAGING — CT CT ABD-PELV W/ CM
2 of 5 series · 17 of 46 positions shown, 19 images · IV contrast (Omnipaque 300)
Comparison: 07/18/2009.

CLINICAL DATA: Left-sided abdominal pain.

CT ABDOMEN AND PELVIS WITH CONTRAST
TECHNIQUE: Multidetector CT imaging of the abdomen and pelvis was
performed following the standard protocol during bolus
administration of intravenous contrast.
Contrast: 100mL OMNIPAQUE IOHEXOL 300 MG/ML  SOLN

[Series 2: abd/ pel 5mm · axial · 0.83mm/px · z∈[-496,-52]mm · 14 of 99 slices shown, 16 images]
[im 5/99  soft-tissue]
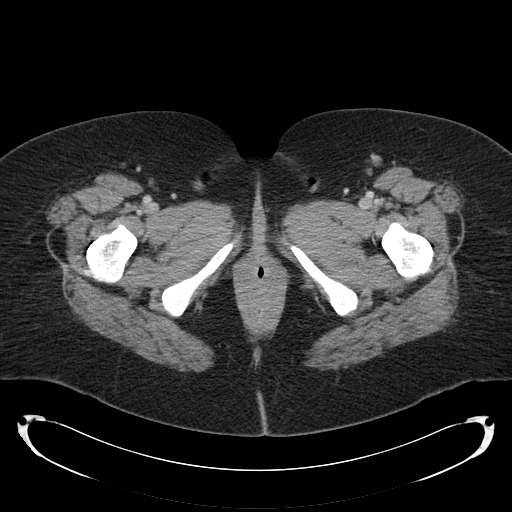
[im 5/99  bone]
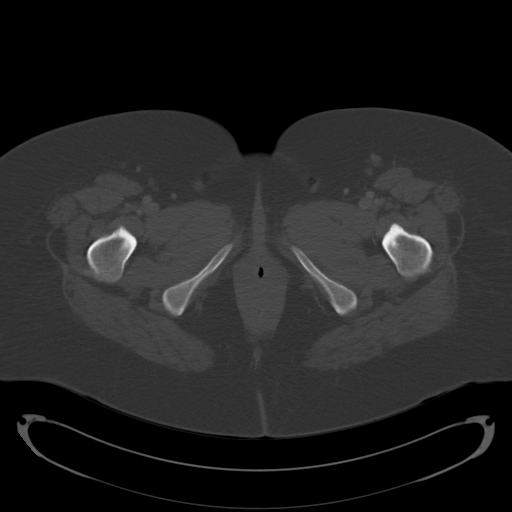
[im 15/99  soft-tissue]
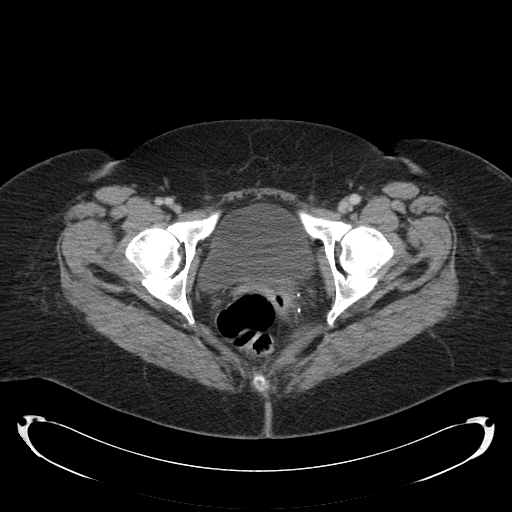
[im 20/99  soft-tissue]
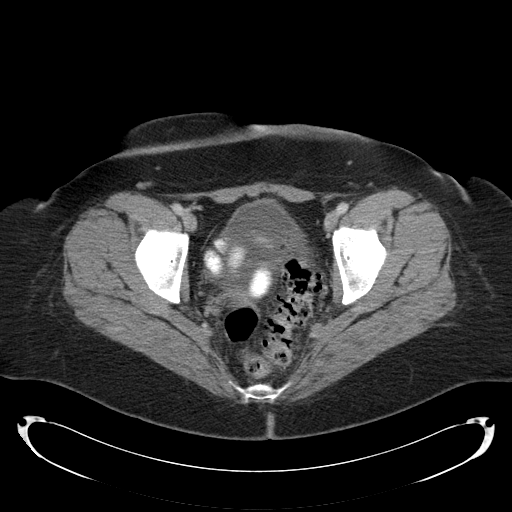
[im 25/99  soft-tissue]
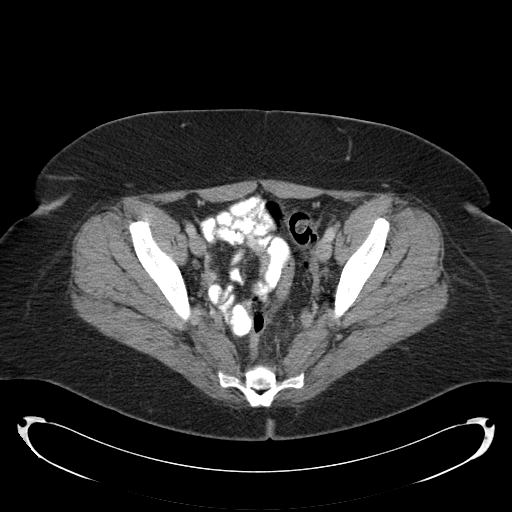
[im 35/99  soft-tissue]
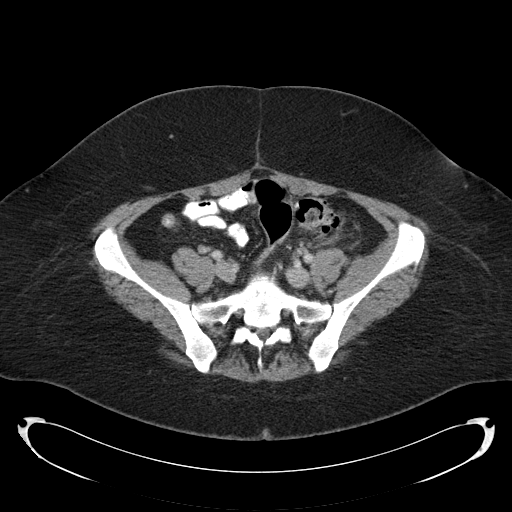
[im 40/99  soft-tissue]
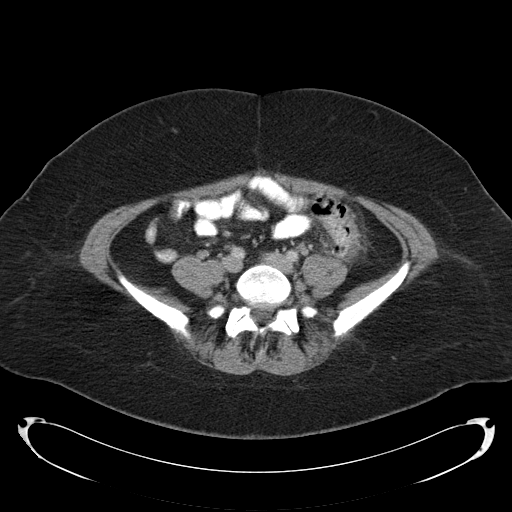
[im 45/99  soft-tissue]
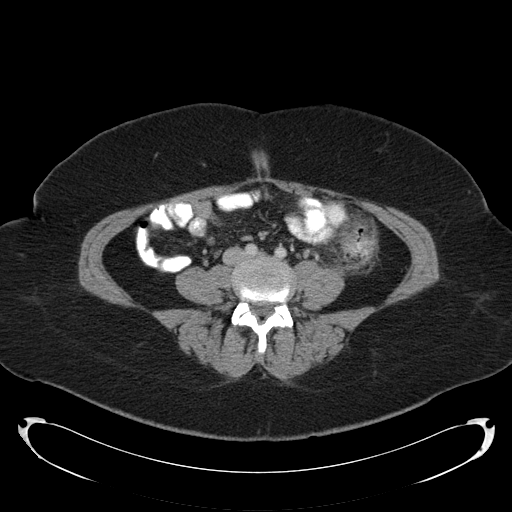
[im 54/99  soft-tissue]
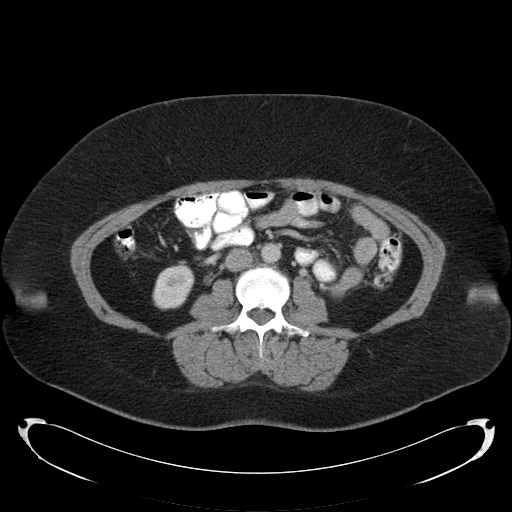
[im 59/99  soft-tissue]
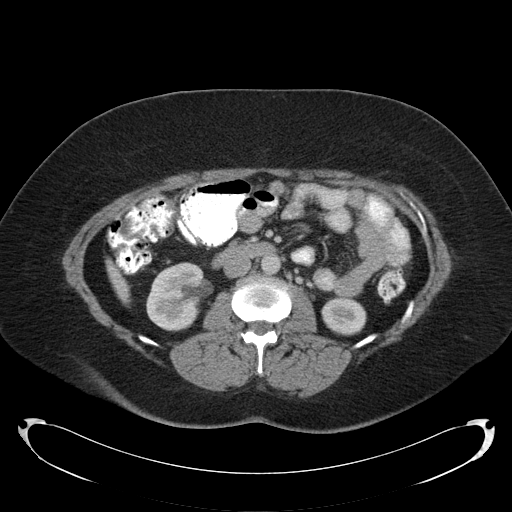
[im 59/99  bone]
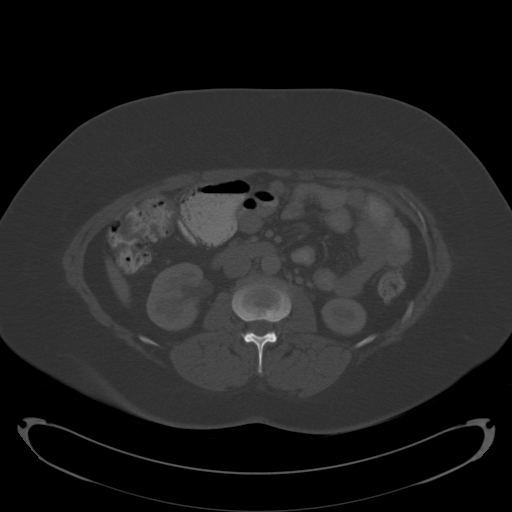
[im 64/99  soft-tissue]
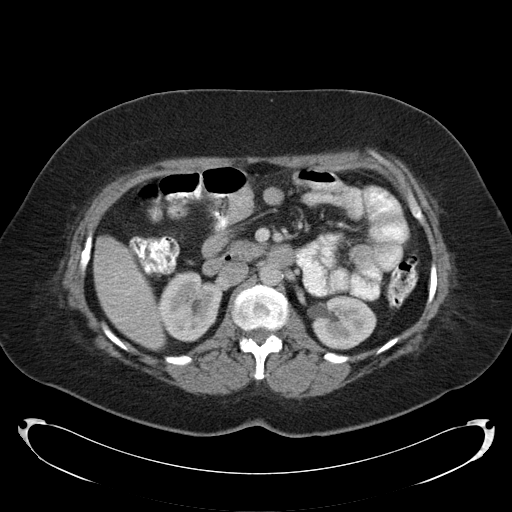
[im 74/99  soft-tissue]
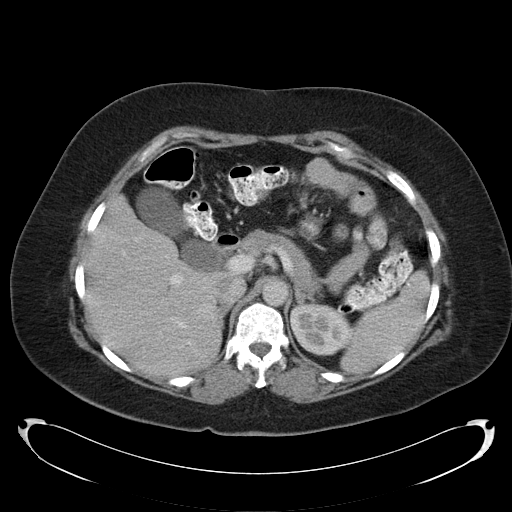
[im 79/99  soft-tissue]
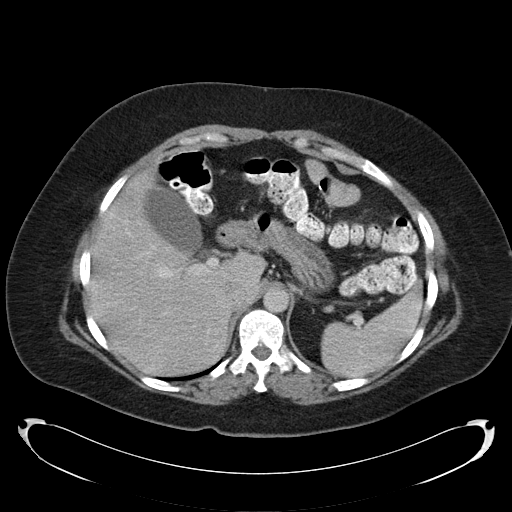
[im 84/99  soft-tissue]
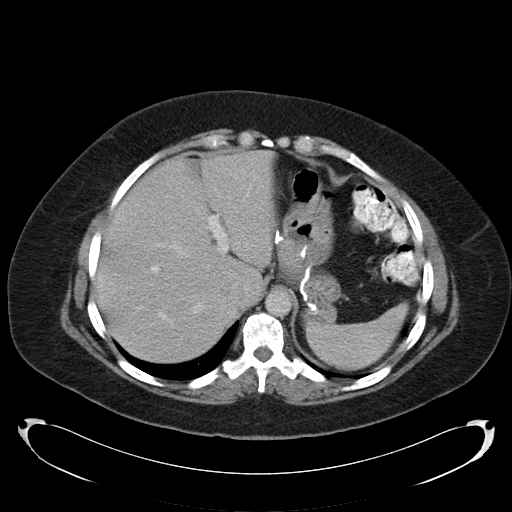
[im 94/99  soft-tissue]
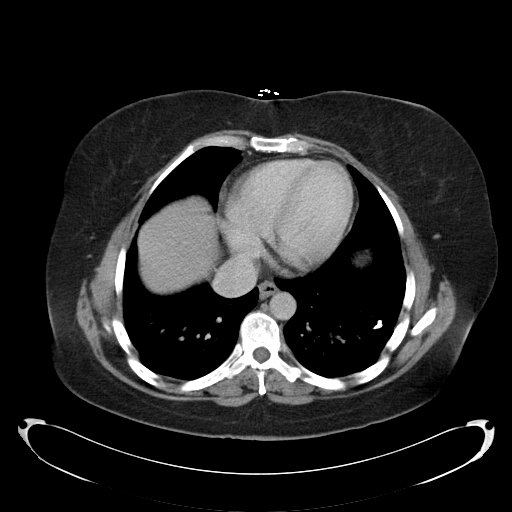

[Series 602: cor · coronal · 0.99mm/px · 3 of 117 slices shown]
[im 39/117  soft-tissue]
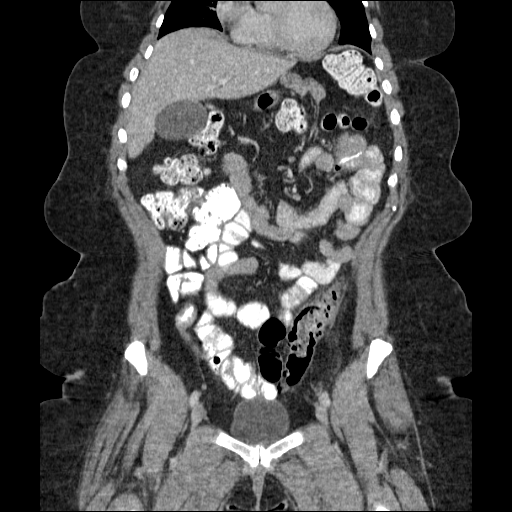
[im 52/117  soft-tissue]
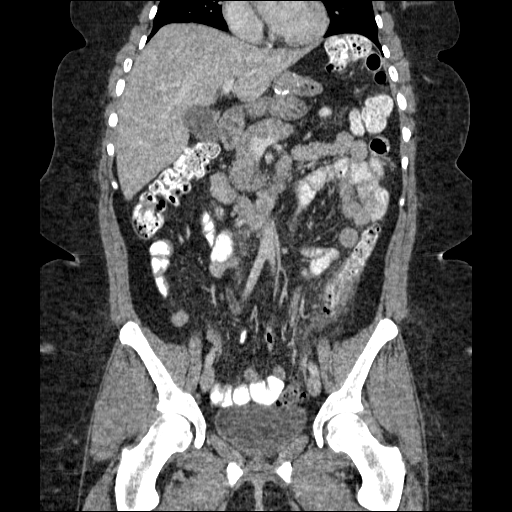
[im 65/117  soft-tissue]
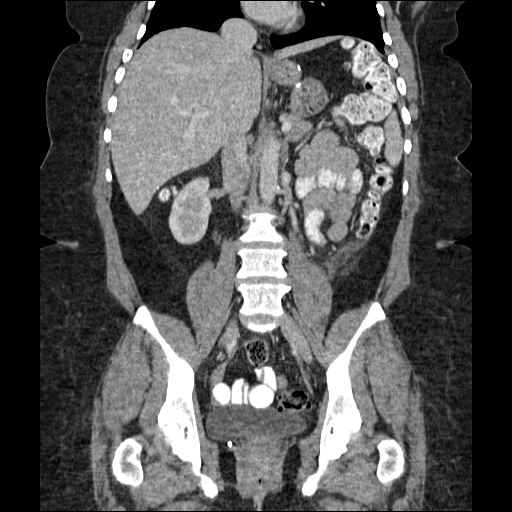

[17 of 46 positions shown; findings below may reference images not displayed]

FINDINGS: The lung bases are clear.  No pleural effusion or
worrisome pulmonary lesion.  There is a left lower lobe calcified
granuloma.

Examination of the abdomen demonstrates surgical changes from
gastric bypass surgery.  No complicating features are demonstrated.
The liver is normal and stable.  The gallbladder is normal.  No
common bile duct dilatation.  The pancreas is normal.  Calcified
splenic granulomas are noted.  The adrenal glands and kidneys are
normal.

The duodenum, small bowel and colon are unremarkable except for
focal area of acute diverticulitis involving the distal descending
colon/sigmoid colon junction region.  No complicating features such
as abscess or free air.

The aorta is normal in caliber.  The major branch vessels are
patent.  The portal and splenic veins are patent.  No mesenteric or
retroperitoneal mass or adenopathy.  Small scattered lymph nodes
are noted.

The uterus is surgically absent.  The ovaries are still present and
appear normal.  The bladder is normal.  No pelvic mass or
adenopathy.  No free pelvic fluid collections.  No inguinal mass or
adenopathy.

The bony structures are intact.
IMPRESSION: 1.  Acute uncomplicated diverticulitis involving the descending
colon and sigmoid colon junction region.
2.  No other significant abdominal/pelvic findings.
3.  Surgical changes from gastric bypass surgery.  No complicating
features are demonstrated.

## 2014-08-13 ENCOUNTER — Encounter: Payer: Self-pay | Admitting: Internal Medicine

## 2014-09-08 ENCOUNTER — Ambulatory Visit (INDEPENDENT_AMBULATORY_CARE_PROVIDER_SITE_OTHER): Payer: Managed Care, Other (non HMO) | Admitting: Emergency Medicine

## 2014-09-08 ENCOUNTER — Ambulatory Visit (INDEPENDENT_AMBULATORY_CARE_PROVIDER_SITE_OTHER): Payer: Managed Care, Other (non HMO)

## 2014-09-08 VITALS — BP 138/86 | HR 114 | Temp 98.0°F | Resp 16 | Ht 65.25 in | Wt 267.6 lb

## 2014-09-08 DIAGNOSIS — R1032 Left lower quadrant pain: Secondary | ICD-10-CM

## 2014-09-08 LAB — POCT URINALYSIS DIPSTICK
Bilirubin, UA: NEGATIVE
Blood, UA: NEGATIVE
Glucose, UA: NEGATIVE
Ketones, UA: NEGATIVE
Leukocytes, UA: NEGATIVE
Nitrite, UA: NEGATIVE
PROTEIN UA: NEGATIVE
SPEC GRAV UA: 1.02
Urobilinogen, UA: 0.2
pH, UA: 5.5

## 2014-09-08 LAB — POCT UA - MICROSCOPIC ONLY
CASTS, UR, LPF, POC: NEGATIVE
Crystals, Ur, HPF, POC: NEGATIVE
Mucus, UA: NEGATIVE
RBC, URINE, MICROSCOPIC: NEGATIVE
Yeast, UA: NEGATIVE

## 2014-09-08 LAB — COMPLETE METABOLIC PANEL WITH GFR
ALT: 13 U/L (ref 0–35)
AST: 16 U/L (ref 0–37)
Albumin: 4.1 g/dL (ref 3.5–5.2)
Alkaline Phosphatase: 81 U/L (ref 39–117)
BUN: 9 mg/dL (ref 6–23)
CALCIUM: 9 mg/dL (ref 8.4–10.5)
CHLORIDE: 104 meq/L (ref 96–112)
CO2: 24 mEq/L (ref 19–32)
Creat: 0.76 mg/dL (ref 0.50–1.10)
GFR, Est Non African American: 88 mL/min
GLUCOSE: 106 mg/dL — AB (ref 70–99)
Potassium: 3.9 mEq/L (ref 3.5–5.3)
SODIUM: 138 meq/L (ref 135–145)
Total Bilirubin: 0.5 mg/dL (ref 0.2–1.2)
Total Protein: 7.2 g/dL (ref 6.0–8.3)

## 2014-09-08 LAB — POCT CBC
Granulocyte percent: 64.7 %G (ref 37–80)
HCT, POC: 39.4 % (ref 37.7–47.9)
HEMOGLOBIN: 12.7 g/dL (ref 12.2–16.2)
LYMPH, POC: 2.8 (ref 0.6–3.4)
MCH: 26.9 pg — AB (ref 27–31.2)
MCHC: 32.4 g/dL (ref 31.8–35.4)
MCV: 83.2 fL (ref 80–97)
MID (CBC): 0.6 (ref 0–0.9)
MPV: 8.3 fL (ref 0–99.8)
PLATELET COUNT, POC: 345 10*3/uL (ref 142–424)
POC GRANULOCYTE: 6.1 (ref 2–6.9)
POC LYMPH %: 29.3 % (ref 10–50)
POC MID %: 6 % (ref 0–12)
RBC: 4.74 M/uL (ref 4.04–5.48)
RDW, POC: 14.3 %
WBC: 9.4 10*3/uL (ref 4.6–10.2)

## 2014-09-08 MED ORDER — AMOXICILLIN-POT CLAVULANATE 875-125 MG PO TABS: 1.0000 | ORAL_TABLET | Freq: Two times a day (BID) | ORAL | Status: DC

## 2014-09-08 NOTE — Progress Notes (Addendum)
Subjective:  This chart was scribed for Darlyne Russian, MD by Starleen Arms, Medical Scribe. This patient was seen in room rm 8 and the patient's care was started at 2:08 PM.   Patient ID: Felicia Zamora, female    DOB: 1955/09/25, 58 y.o.   MRN: [id number]  HPI HPI Comments: Felicia Zamora is a 58 y.o. female who presents to the [facility] complaining of a productive cough onset 12/24 with associated sore throat, congestion, headache, and subjective fever.  She reports an infrequent hsitory of URI.    Patient also complains of worsening, more marked last night, LLQ abdominal pain onset 12/22 with associated increased urinary frequency, flatulence and nausea.  She reports a history of diverticultis over the past 20 years with approximately 4-5 episodes in 2013, 1 episode 2 years ago, and 1 episode 1.5 years ago.  She was seen 2 years by her gastroenterologist and prescribed an antibiotic, of which she cannot remember the name, which afforded relief.  She had a normal scope earlier this year.  She has never been hospitalized for this complaint.   Patient denies a history of nephrolithiasis. Patient denies vomiting, constipation.      Review of Systems  Constitutional: Positive for fever.  HENT: Positive for congestion and sore throat.   Respiratory: Positive for cough.   Gastrointestinal: Positive for nausea and abdominal pain. Negative for vomiting and constipation.  Genitourinary: Positive for frequency.  Neurological: Positive for headaches.       Objective:   Physical Exam  Constitutional: She is oriented to person, place, and time. She appears well-developed and well-nourished. No distress.  HENT:  Head: Normocephalic and atraumatic.  Eyes: Conjunctivae and EOM are normal.  Neck: Neck supple. No tracheal deviation present.  Cardiovascular: Normal rate.   Pulmonary/Chest: Effort normal. No respiratory distress.  Occasional rhonchi on left-sided chest.    Abdominal: There is tenderness.  There is no rebound.  Tender left mid-abdomen without rebound.   Musculoskeletal: Normal range of motion.  Neurological: She is alert and oriented to person, place, and time.  Skin: Skin is warm and dry.  Psychiatric: She has a normal mood and affect. Her behavior is normal.  Nursing note and vitals reviewed.  Results for orders placed or performed in visit on 09/08/14  POCT UA - Microscopic Only  Result Value Ref Range   WBC, Ur, HPF, POC 0-1    RBC, urine, microscopic neg    Bacteria, U Microscopic trace    Mucus, UA neg    Epithelial cells, urine per micros 0-2    Crystals, Ur, HPF, POC neg    Casts, Ur, LPF, POC neg    Yeast, UA neg   POCT urinalysis dipstick  Result Value Ref Range   Color, UA yellow    Clarity, UA clear    Glucose, UA neg    Bilirubin, UA neg    Ketones, UA neg    Spec Grav, UA 1.020    Blood, UA neg    pH, UA 5.5    Protein, UA neg    Urobilinogen, UA 0.2    Nitrite, UA neg    Leukocytes, UA Negative   POCT CBC  Result Value Ref Range   WBC 9.4 4.6 - 10.2 K/uL   Lymph, poc 2.8 0.6 - 3.4   POC LYMPH PERCENT 29.3 10 - 50 %L   MID (cbc) 0.6 0 - 0.9   POC MID % 6.0 0 - 12 %M  POC Granulocyte 6.1 2 - 6.9   Granulocyte percent 64.7 37 - 80 %G   RBC 4.74 4.04 - 5.48 M/uL   Hemoglobin 12.7 12.2 - 16.2 g/dL   HCT, POC 39.4 37.7 - 47.9 %   MCV 83.2 80 - 97 fL   MCH, POC 26.9 (A) 27 - 31.2 pg   MCHC 32.4 31.8 - 35.4 g/dL   RDW, POC 14.3 %   Platelet Count, POC 345 142 - 424 K/uL   MPV 8.3 0 - 99.8 fL    2:15 PM Will order labs and imaging.  Patient acknowledges and agrees with plan.    3:03 PM Primary X-ray reading by Dr. Everlene Farrier: Calcific density 1 cm in left base.  No definite air fluid levels or evidence of obstruction.      Assessment & Plan:  We'll start Augmentin 875 twice a day. We'll try to arrange CT abdomen pelvis tomorrow. She has a history of recurrent diverticulitis. Her last episode was in 2014.I personally performed the services  described in this documentation, which was scribed in my presence. The recorded information has been reviewed and is accurate.

## 2014-09-11 ENCOUNTER — Telehealth: Payer: Self-pay | Admitting: Neurology

## 2014-09-11 NOTE — Telephone Encounter (Signed)
Called patient to reschedule due to template change but patient does not wish to reschedule at this time, did not get machine, out of pocket too expensive, can not afford

## 2014-10-10 ENCOUNTER — Ambulatory Visit: Payer: Managed Care, Other (non HMO) | Admitting: Neurology

## 2015-03-25 ENCOUNTER — Encounter: Payer: Self-pay | Admitting: Internal Medicine

## 2019-01-31 ENCOUNTER — Encounter: Payer: Self-pay | Admitting: Internal Medicine

## 2019-02-14 ENCOUNTER — Encounter: Payer: Self-pay | Admitting: Internal Medicine

## 2019-03-06 ENCOUNTER — Telehealth: Payer: Self-pay | Admitting: *Deleted

## 2019-03-06 NOTE — Telephone Encounter (Signed)
Patient has return call and is reschedule.

## 2019-03-06 NOTE — Telephone Encounter (Signed)
Called patient x 3 for Previsit. At last call patient requesting to call back later, she was in a bank.Instructed to call back prior to 5 pm today to reschedule the Previsit. If no call back by 5 pm, patient aware that pv and procedure will be cancelled.Patient verbalized understanding and plans to call back today.

## 2019-03-08 ENCOUNTER — Ambulatory Visit: Payer: 59

## 2019-03-08 ENCOUNTER — Other Ambulatory Visit: Payer: Self-pay

## 2019-03-08 VITALS — Ht 65.0 in | Wt 263.0 lb

## 2019-03-08 DIAGNOSIS — Z86010 Personal history of colonic polyps: Secondary | ICD-10-CM

## 2019-03-08 DIAGNOSIS — Z800 Family history of malignant neoplasm of digestive organs: Secondary | ICD-10-CM

## 2019-03-08 NOTE — Progress Notes (Signed)
Per pt, no allergies to soy or egg products.Pt not taking any weight loss meds or using  O2 at home. Pt denies sedation problems. Pt refused emmi video.  The PV was done over the phone due to COVID-19. Verified the pt's address and insurance. Reviewed medical hx and prep instructions with pt and will mail her the paperwork.  Informed pt to call with any questions or changes prior to the procedure. Pt understood.

## 2019-03-15 ENCOUNTER — Telehealth: Payer: Self-pay | Admitting: Internal Medicine

## 2019-03-15 NOTE — Telephone Encounter (Signed)
Spoke with patient regarding Covid-19 screening questions °Covid-19 Screening Questions ° °Do you now or have you had a fever in the last 14 days?   no   ° °Do you have any respiratory symptoms of shortness of breath or cough now or in the last 14 days?  no   ° °Do you have any family members or close contacts with diagnosed or suspected Covid-19 in the past 14 days?  no ° °Have you been tested for Covid-19 and found to be positive?  no  ° ° °

## 2019-03-19 ENCOUNTER — Ambulatory Visit (AMBULATORY_SURGERY_CENTER): Payer: 59 | Admitting: Internal Medicine

## 2019-03-19 ENCOUNTER — Other Ambulatory Visit: Payer: Self-pay

## 2019-03-19 ENCOUNTER — Encounter: Payer: Self-pay | Admitting: Internal Medicine

## 2019-03-19 VITALS — BP 119/60 | HR 69 | Temp 98.2°F | Resp 18 | Ht 65.0 in | Wt 263.0 lb

## 2019-03-19 DIAGNOSIS — Z1211 Encounter for screening for malignant neoplasm of colon: Secondary | ICD-10-CM | POA: Diagnosis not present

## 2019-03-19 DIAGNOSIS — D123 Benign neoplasm of transverse colon: Secondary | ICD-10-CM | POA: Diagnosis not present

## 2019-03-19 DIAGNOSIS — D124 Benign neoplasm of descending colon: Secondary | ICD-10-CM | POA: Diagnosis not present

## 2019-03-19 DIAGNOSIS — Z800 Family history of malignant neoplasm of digestive organs: Secondary | ICD-10-CM | POA: Diagnosis not present

## 2019-03-19 MED ORDER — SODIUM CHLORIDE 0.9 % IV SOLN: 500.0000 mL | Freq: Once | INTRAVENOUS | Status: DC

## 2019-03-19 NOTE — Progress Notes (Signed)
Pt's states no medical or surgical changes since previsit or office visit. 

## 2019-03-19 NOTE — Patient Instructions (Addendum)
I found and removed 3 tiny polyps. I will let you know pathology results and when to have another routine colonoscopy by mail and/or My Chart.  You also have a condition called diverticulosis - common and not usually a problem. Please read the handout provided.  I appreciate the opportunity to care for you. Gatha Mayer, MD, FACG     YOU HAD AN ENDOSCOPIC PROCEDURE TODAY AT [facility]:   Refer to the procedure report that was given to you for any specific questions about what was found during the examination.  If the procedure report does not answer your questions, please call your gastroenterologist to clarify.  If you requested that your care partner not be given the details of your procedure findings, then the procedure report has been included in a sealed envelope for you to review at your convenience later.  YOU SHOULD EXPECT: Some feelings of bloating in the abdomen. Passage of more gas than usual.  Walking can help get rid of the air that was put into your GI tract during the procedure and reduce the bloating. If you had a lower endoscopy (such as a colonoscopy or flexible sigmoidoscopy) you may notice spotting of blood in your stool or on the toilet paper. If you underwent a bowel prep for your procedure, you may not have a normal bowel movement for a few days.  Please Note:  You might notice some irritation and congestion in your nose or some drainage.  This is from the oxygen used during your procedure.  There is no need for concern and it should clear up in a day or so.  SYMPTOMS TO REPORT IMMEDIATELY:   Following lower endoscopy (colonoscopy or flexible sigmoidoscopy):  Excessive amounts of blood in the stool  Significant tenderness or worsening of abdominal pains  Swelling of the abdomen that is new, acute  Fever of 100F or higher  For urgent or emergent issues, a gastroenterologist can be reached at any hour by calling [phone].   DIET:   We do recommend a small meal at first, but then you may proceed to your regular diet.  Drink plenty of fluids but you should avoid alcoholic beverages for 24 hours.  ACTIVITY:  You should plan to take it easy for the rest of today and you should NOT DRIVE or use heavy machinery until tomorrow (because of the sedation medicines used during the test).    FOLLOW UP: Our staff will call the number listed on your records 48-72 hours following your procedure to check on you and address any questions or concerns that you may have regarding the information given to you following your procedure. If we do not reach you, we will leave a message.  We will attempt to reach you two times.  During this call, we will ask if you have developed any symptoms of COVID 19. If you develop any symptoms (ie: fever, flu-like symptoms, shortness of breath, cough etc.) before then, please call [phone].  If you test positive for Covid 19 in the 2 weeks post procedure, please call and report this information to [place].    If any biopsies were taken you will be contacted by phone or by letter within the next 1-3 weeks.  Please call us at [phone] if you have not heard about the biopsies in 3 weeks.    SIGNATURES/CONFIDENTIALITY: You and/or your care partner have signed paperwork which will be entered into your electronic medical record.  These  signatures attest to the fact that that the information above on your After Visit Summary has been reviewed and is understood.  Full responsibility of the confidentiality of this discharge information lies with you and/or your care-partner.    Handouts were given to you on polyps and diverticulosis. You may resume your current medications today. Await biopsy results. Please call if any questions or concerns.

## 2019-03-19 NOTE — Progress Notes (Signed)
Courtney Washington took temp and Judy Branson took vitals. 

## 2019-03-19 NOTE — Progress Notes (Signed)
No problems noted in the recovery room. maw 

## 2019-03-19 NOTE — Progress Notes (Signed)
PT taken to PACU. Monitors in place. VSS. Report given to RN. 

## 2019-03-19 NOTE — Progress Notes (Signed)
Called to room to assist during endoscopic procedure.  Patient ID and intended procedure confirmed with present staff. Received instructions for my participation in the procedure from the performing physician.  

## 2019-03-19 NOTE — Op Note (Signed)
[facility] Patient Name: Felicia Zamora Procedure Date: 03/19/2019 10:51 AM MRN: [id number] Endoscopist: Gatha Mayer , MD Age: 63 Referring MD:  Date of Birth: [birth year] Gender: Female Account #: [id number] Procedure:                Colonoscopy Indications:              Screening in patient at increased risk: Colorectal                            cancer in sister before age 97 Medicines:                Propofol per Anesthesia, Monitored Anesthesia Care Procedure:                Pre-Anesthesia Assessment:                           - Prior to the procedure, a History and Physical                            was performed, and patient medications and                            allergies were reviewed. The patient's tolerance of                            previous anesthesia was also reviewed. The risks                            and benefits of the procedure and the sedation                            options and risks were discussed with the patient.                            All questions were answered, and informed consent                            was obtained. Prior Anticoagulants: The patient has                            taken no previous anticoagulant or antiplatelet                            agents. ASA Grade Assessment: III - A patient with                            severe systemic disease. After reviewing the risks                            and benefits, the patient was deemed in                            satisfactory condition to undergo the procedure.  After obtaining informed consent, the colonoscope                            was passed under direct vision. Throughout the                            procedure, the patient's blood pressure, pulse, and                            oxygen saturations were monitored continuously. The                            Colonoscope was introduced through the anus and   advanced to the the cecum, identified by                            appendiceal orifice and ileocecal valve. The                            colonoscopy was performed without difficulty. The                            patient tolerated the procedure well. The quality                            of the bowel preparation was good. The ileocecal                            valve, appendiceal orifice, and rectum were                            photographed. Scope In: 10:56:06 AM Scope Out: 84:16:60 AM Scope Withdrawal Time: 0 hours 13 minutes 2 seconds  Total Procedure Duration: 0 hours 20 minutes 6 seconds  Findings:                 The perianal and digital rectal examinations were                            normal.                           Three sessile polyps were found in the transverse                            colon. The polyps were diminutive in size. These                            polyps were removed with a cold snare. Resection                            and retrieval were complete. Verification of                            patient identification for the specimen was done.  Estimated blood loss was minimal.                           Many diverticula were found in the sigmoid colon                            and descending colon.                           The exam was otherwise without abnormality on                            direct and retroflexion views. Complications:            No immediate complications. Estimated Blood Loss:     Estimated blood loss was minimal. Impression:               - Three diminutive polyps in the transverse colon,                            removed with a cold snare. Resected and retrieved.                           - Diverticulosis in the sigmoid colon and in the                            descending colon.                           - The examination was otherwise normal on direct                            and retroflexion  views. Recommendation:           - Patient has a contact number available for                            emergencies. The signs and symptoms of potential                            delayed complications were discussed with the                            patient. Return to normal activities tomorrow.                            Written discharge instructions were provided to the                            patient.                           - Resume previous diet.                           - Continue present medications.                           -  Repeat colonoscopy is recommended for                            surveillance. The colonoscopy date will be                            determined after pathology results from today's                            exam become available for review.                           CC: [place] Gatha Mayer, MD 03/19/2019 11:22:32 AM This report has been signed electronically.

## 2019-03-21 ENCOUNTER — Telehealth: Payer: Self-pay | Admitting: *Deleted

## 2019-03-21 NOTE — Telephone Encounter (Signed)
1. Have you developed a fever since your procedure? no  2.   Have you had an respiratory symptoms (SOB or cough) since your procedure? no  3.   Have you tested positive for COVID 19 since your procedure no  4.   Have you had any family members/close contacts diagnosed with the COVID 19 since your procedure?  no   If yes to any of these questions please route to Joylene John, RN and Alphonsa Gin, Therapist, sports.  Follow up Call-  Call back number 03/19/2019  Post procedure Call Back phone  # [phone]  Permission to leave phone message Yes  Some recent data might be hidden     Patient questions:  Do you have a fever, pain , or abdominal swelling? No. Pain Score  0 *  Have you tolerated food without any problems? Yes.    Have you been able to return to your normal activities? Yes.    Do you have any questions about your discharge instructions: Diet   No. Medications  No. Follow up visit  No.  Do you have questions or concerns about your Care? No.  Actions: * If pain score is 4 or above: No action needed, pain <4.

## 2019-03-24 ENCOUNTER — Encounter: Payer: Self-pay | Admitting: Internal Medicine

## 2019-03-24 DIAGNOSIS — Z860100 Personal history of colon polyps, unspecified: Secondary | ICD-10-CM

## 2019-03-24 HISTORY — DX: Personal history of colonic polyps: Z86.010

## 2019-03-24 HISTORY — DX: Personal history of colon polyps, unspecified: Z86.0100

## 2019-03-24 NOTE — Progress Notes (Signed)
dimin ssp dimin adenoma  Recall 2025  My Chart

## 2024-03-01 ENCOUNTER — Encounter: Payer: Self-pay | Admitting: Internal Medicine

## 2024-04-18 ENCOUNTER — Encounter: Payer: Self-pay | Admitting: Internal Medicine

## 2024-04-18 ENCOUNTER — Ambulatory Visit (AMBULATORY_SURGERY_CENTER): Payer: Self-pay

## 2024-04-18 VITALS — Ht 65.0 in | Wt 254.0 lb

## 2024-04-18 DIAGNOSIS — Z860100 Personal history of colon polyps, unspecified: Secondary | ICD-10-CM

## 2024-04-18 DIAGNOSIS — Z800 Family history of malignant neoplasm of digestive organs: Secondary | ICD-10-CM

## 2024-04-18 MED ORDER — NA SULFATE-K SULFATE-MG SULF 17.5-3.13-1.6 GM/177ML PO SOLN: 1.0000 | Freq: Once | ORAL | 0 refills | Status: AC

## 2024-04-18 NOTE — Progress Notes (Signed)
  Pre visit completed over telephone,   No egg or soy allergy known to patient  No issues known to pt with past sedation with any surgeries or procedures Patient denies ever being told they had issues or difficulty with intubation  No FH of Malignant Hyperthermia Pt is not on diet pills Pt is not on  home 02  Pt is not on blood thinners  Pt denies issues with constipation  No A fib or A flutter Have any cardiac testing pending-- NO Pt instructed to use Singlecare.com or GoodRx for   Texted a  Good RX coupon to be used at [facility]- For price reduction on prep  -  patient is aware  Chart reviewed by DOROTHA Schillings, CRNA

## 2024-04-24 NOTE — Progress Notes (Signed)
 [facility] History and Physical   Primary Care Physician:  Silvano Angeline FALCON, NP   Reason for Procedure:    Encounter Diagnoses  Name Primary?   Hx of colonic polyps Yes   FH: colon cancer      Plan:    Colonoscopy     HPI: Felicia Zamora is a 68 y.o. female here for a surveillance and screening examination.  She has a history of an adenoma and SSP in 2020, both diminutive as well as left-sided diverticulosis at that colonoscopy.  A sister had colon cancer diagnosed at 60.   Past Medical History:  Diagnosis Date   Allergy    seasonal   Anemia    Arthritis    Bowel obstruction (HCC)    2012   Colon polyp    Diverticulitis of colon (without mention of hemorrhage)(562.11) 11/03/2012   Diverticulosis    Hx of colonic polyps 03/24/2019   Hypertension    Morbid (severe) obesity due to excess calories (HCC) 04/25/2024   bmi 42.27   Sleep apnea    not using cpap   Thyroid disease    Weakness    due to arthritis in knees    Past Surgical History:  Procedure Laterality Date   ABDOMINAL HYSTERECTOMY  07/10/2007   Diagnosis: menorrhagia   CESAREAN SECTION  1990   1 time   COLONOSCOPY W/ POLYPECTOMY     multiple   GASTRIC BYPASS  2006   SPINE SURGERY  2006   C-spine herniated disc   TUBAL LIGATION       Current Outpatient Medications  Medication Sig Dispense Refill   amLODipine (NORVASC) 10 MG tablet Take 10 mg by mouth daily.     atorvastatin (LIPITOR) 10 MG tablet Take 10 mg by mouth daily.     levothyroxine (SYNTHROID) 75 MCG tablet Take 75 mcg by mouth daily.     lisinopril (ZESTRIL) 10 MG tablet Take 10 mg by mouth daily.     MAGNESIUM  PO Take by mouth as needed.      meloxicam  (MOBIC ) 15 MG tablet Take 15 mg by mouth as needed for pain.     metFORMIN (GLUCOPHAGE) 500 MG tablet Take 1,000 mg by mouth daily.     Multiple Vitamin (MULTIVITAMIN) tablet Take 1 tablet by mouth daily.     nebivolol (BYSTOLIC) 5 MG tablet Take 5 mg by mouth daily.      VITAMIN D , CHOLECALCIFEROL, PO Take 5,000 Units by mouth daily.      CALCIUM PO Take 1 capsule by mouth 2 (two) times daily.     ferrous sulfate  (FERROUSUL) 325 (65 FE) MG tablet Take 1 tablet (325 mg total) by mouth daily with breakfast. (Patient not taking: No sig reported)  0   omeprazole (PRILOSEC) 40 MG capsule TAKE 1 CAPSULE BY MOUTH ONCE DAILY FOR 90 DAYS     Current Facility-Administered Medications  Medication Dose Route Frequency Provider Last Rate Last Admin   0.9 %  sodium chloride  infusion  500 mL Intravenous Once Avram Lupita FORBES, MD        Allergies as of 04/25/2024 - Review Complete 04/25/2024  Allergen Reaction Noted   Sulfonamide derivatives     Codeine  02/16/2012   Fluticasone Swelling 11/20/2021   Hydrochlorothiazide  02/16/2012   Oxycodone-acetaminophen  Other (See Comments)    Paxil [paroxetine hcl]  02/16/2012   Sulfamethoxazole-trimethoprim Other (See Comments) 12/21/2019    Family History  Problem Relation Age of Onset   Diabetes Mother  Stomach cancer Mother    Diabetes Father    Heart disease Father    Hypertension Father    Cirrhosis Brother    Colon cancer Sister 47   Colon polyps Sister    Diverticulitis Sister    Diabetes Sister    Diabetes Brother    Thyroid disease Daughter    Lung cancer Maternal Grandfather    Alzheimer's disease Paternal Grandmother    Esophageal cancer Neg Hx    Rectal cancer Neg Hx     Social History   Socioeconomic History   Marital status: Divorced    Spouse name: Lamar   Number of children: 4   Years of education: some coll.   Highest education level: Not on file  Occupational History   Occupation: Radio broadcast assistant: [employer]    Comment: work from home  Tobacco Use   Smoking status: Never   Smokeless tobacco: Never  Vaping Use   Vaping status: Never Used  Substance and Sexual Activity   Alcohol use: Not Currently    Alcohol/week: 1.0 - 2.0 standard drink of alcohol    Types: 1 - 2 Glasses  of wine per week   Drug use: No   Sexual activity: Not on file  Other Topics Concern   Not on file  Social History Narrative   Patient is left handed and resides with husband,consumes 3 cups of caffeine daily   Social Drivers of Corporate investment banker Strain: Not on file  Food Insecurity: Not on file  Transportation Needs: Not on file  Physical Activity: Not on file  Stress: Not on file  Social Connections: Not on file  Intimate Partner Violence: Not on file    Review of Systems:  All other review of systems negative except as mentioned in the HPI.  Physical Exam: Vital signs BP (!) 141/70   Pulse 78   Temp (!) 97.3 F (36.3 C) (Temporal)   Ht 5' 5 (1.651 m)   Wt 254 lb (115.2 kg)   SpO2 96%   BMI 42.27 kg/m   General:   Alert,  Well-developed, well-nourished, pleasant and cooperative in NAD Lungs:  Clear throughout to auscultation.   Heart:  Regular rate and rhythm; no murmurs, clicks, rubs,  or gallops. Abdomen:  Soft, nontender and nondistended. Normal bowel sounds.   Neuro/Psych:  Alert and cooperative. Normal mood and affect. A and O x 3   @Meela Wareing  CHARLENA Commander, MD, [facility] [phone] (pager) 04/25/2024 11:37 AM@

## 2024-04-25 ENCOUNTER — Encounter: Payer: Self-pay | Admitting: Internal Medicine

## 2024-04-25 ENCOUNTER — Ambulatory Visit: Admitting: Internal Medicine

## 2024-04-25 VITALS — BP 105/53 | HR 68 | Temp 97.3°F | Resp 16 | Ht 65.0 in | Wt 254.0 lb

## 2024-04-25 DIAGNOSIS — Z800 Family history of malignant neoplasm of digestive organs: Secondary | ICD-10-CM | POA: Diagnosis not present

## 2024-04-25 DIAGNOSIS — K5730 Diverticulosis of large intestine without perforation or abscess without bleeding: Secondary | ICD-10-CM

## 2024-04-25 DIAGNOSIS — Z860101 Personal history of adenomatous and serrated colon polyps: Secondary | ICD-10-CM | POA: Diagnosis not present

## 2024-04-25 DIAGNOSIS — Z1211 Encounter for screening for malignant neoplasm of colon: Secondary | ICD-10-CM

## 2024-04-25 DIAGNOSIS — Z860100 Personal history of colon polyps, unspecified: Secondary | ICD-10-CM

## 2024-04-25 HISTORY — DX: Morbid (severe) obesity due to excess calories: E66.01

## 2024-04-25 MED ORDER — SODIUM CHLORIDE 0.9 % IV SOLN: 500.0000 mL | Freq: Once | INTRAVENOUS | Status: DC

## 2024-04-25 NOTE — Progress Notes (Signed)
 Pt's states no medical or surgical changes since previsit or office visit.

## 2024-04-25 NOTE — Op Note (Addendum)
 [facility] Patient Name: Felicia Zamora Procedure Date: 04/25/2024 10:37 AM MRN: [id number] Endoscopist: Lupita FORBES Commander , MD, [id number] Age: 67 Referring MD:  Date of Birth: [birth year] Gender: Female Account #: [id number] Procedure:                Colonoscopy Indications:              High risk colon cancer surveillance: Personal                            history of colonic polyps and FHx CRCA sister age 92 Medicines:                Monitored Anesthesia Care Procedure:                Pre-Anesthesia Assessment:                           - Prior to the procedure, a History and Physical                            was performed, and patient medications and                            allergies were reviewed. The patient's tolerance of                            previous anesthesia was also reviewed. The risks                            and benefits of the procedure and the sedation                            options and risks were discussed with the patient.                            All questions were answered, and informed consent                            was obtained. Prior Anticoagulants: The patient has                            taken no anticoagulant or antiplatelet agents. ASA                            Grade Assessment: III - A patient with severe                            systemic disease. After reviewing the risks and                            benefits, the patient was deemed in satisfactory                            condition to undergo the procedure.  After obtaining informed consent, the colonoscope                            was passed under direct vision. Throughout the                            procedure, the patient's blood pressure, pulse, and                            oxygen saturations were monitored continuously. The                            CF [id number] #[id number] was introduced through the anus                            and  advanced to the the cecum, identified by                            appendiceal orifice and ileocecal valve. The                            colonoscopy was somewhat difficult due to                            significant looping. Successful completion of the                            procedure was aided by applying abdominal pressure.                            The patient tolerated the procedure well. The                            quality of the bowel preparation was adequate. The                            bowel preparation used was SUPREP via split dose                            instruction. The ileocecal valve, appendiceal                            orifice, and rectum were photographed. Scope In: 11:44:24 AM Scope Out: 11:58:55 AM Scope Withdrawal Time: 0 hours 8 minutes 0 seconds  Total Procedure Duration: 0 hours 14 minutes 31 seconds  Findings:                 The perianal and digital rectal examinations were                            normal.                           Multiple large-mouthed and small-mouthed  diverticula were found in the sigmoid colon,                            descending colon and transverse colon. There was no                            evidence of diverticular bleeding.                           The exam was otherwise without abnormality on                            direct and retroflexion views. Complications:            No immediate complications. Estimated Blood Loss:     Estimated blood loss: none. Impression:               - Severe diverticulosis in the sigmoid colon, in                            the descending colon and in the transverse colon.                            There was no evidence of diverticular bleeding.                           - The examination was otherwise normal on direct                            and retroflexion views.                           - No specimens collected. Recommendation:           -  Patient has a contact number available for                            emergencies. The signs and symptoms of potential                            delayed complications were discussed with the                            patient. Return to normal activities tomorrow.                            Written discharge instructions were provided to the                            patient.                           - Resume previous diet.                           - Continue present medications.                           -  Repeat colonoscopy in 5 years. Hx diminutive                            adenoma and ssp in 2020 + sister had CRCA dx at age                            71 Lupita FORBES Commander, MD 04/25/2024 12:05:20 PM This report has been signed electronically.

## 2024-04-25 NOTE — Progress Notes (Signed)
 Report given to PACU, vss

## 2024-04-25 NOTE — Patient Instructions (Addendum)
 No polyps or cancer seen today.  You do have diverticulosis - thickened muscle rings and pouches in the colon wall. Please read the handout about this condition.  Your next routine colonoscopy should be in 5 years - 2030.  I appreciate the opportunity to care for you. Lupita CHARLENA Commander, MD, FACG YOU HAD AN ENDOSCOPIC PROCEDURE TODAY AT THE [facility]:   Refer to the procedure report that was given to you for any specific questions about what was found during the examination.  If the procedure report does not answer your questions, please call your gastroenterologist to clarify.  If you requested that your care partner not be given the details of your procedure findings, then the procedure report has been included in a sealed envelope for you to review at your convenience later.  YOU SHOULD EXPECT: Some feelings of bloating in the abdomen. Passage of more gas than usual.  Walking can help get rid of the air that was put into your GI tract during the procedure and reduce the bloating. If you had a lower endoscopy (such as a colonoscopy or flexible sigmoidoscopy) you may notice spotting of blood in your stool or on the toilet paper. If you underwent a bowel prep for your procedure, you may not have a normal bowel movement for a few days.  Please Note:  You might notice some irritation and congestion in your nose or some drainage.  This is from the oxygen used during your procedure.  There is no need for concern and it should clear up in a day or so.  SYMPTOMS TO REPORT IMMEDIATELY:  Following lower endoscopy (colonoscopy or flexible sigmoidoscopy):  Excessive amounts of blood in the stool  Significant tenderness or worsening of abdominal pains  Swelling of the abdomen that is new, acute  Fever of 100F or higher  For urgent or emergent issues, a gastroenterologist can be reached at any hour by calling [phone]. Do not use MyChart messaging for urgent concerns.    DIET:  We do  recommend a small meal at first, but then you may proceed to your regular diet.  Drink plenty of fluids but you should avoid alcoholic beverages for 24 hours.  ACTIVITY:  You should plan to take it easy for the rest of today and you should NOT DRIVE or use heavy machinery until tomorrow (because of the sedation medicines used during the test).    FOLLOW UP: Our staff will call the number listed on your records the next business day following your procedure.  We will call around 7:15- 8:00 am to check on you and address any questions or concerns that you may have regarding the information given to you following your procedure. If we do not reach you, we will leave a message.     If any biopsies were taken you will be contacted by phone or by letter within the next 1-3 weeks.  Please call us  at [phone] if you have not heard about the biopsies in 3 weeks.    SIGNATURES/CONFIDENTIALITY: You and/or your care partner have signed paperwork which will be entered into your electronic medical record.  These signatures attest to the fact that that the information above on your After Visit Summary has been reviewed and is understood.  Full responsibility of the confidentiality of this discharge information lies with you and/or your care-partner.

## 2024-04-26 ENCOUNTER — Telehealth: Payer: Self-pay | Admitting: Lactation Services

## 2024-04-26 NOTE — Telephone Encounter (Signed)
  Follow up Call-     04/25/2024   10:54 AM  Call back number  Post procedure Call Back phone  # [phone]  Permission to leave phone message Yes     Patient questions:  Do you have a fever, pain , or abdominal swelling? No. Pain Score  0 *  Have you tolerated food without any problems? Yes.    Have you been able to return to your normal activities? Yes.    Do you have any questions about your discharge instructions: Diet   No. Medications  No. Follow up visit  No.  Do you have questions or concerns about your Care? No.  Actions: * If pain score is 4 or above: No action needed, pain <4.
